# Patient Record
Sex: Female | Born: 1950 | Race: White | Hispanic: No | Marital: Married | Smoking: Current every day smoker
Health system: Southern US, Community
[De-identification: ages and names within clinical notes are randomized; demographics above are authoritative.]

## PROBLEM LIST (undated history)

## (undated) DIAGNOSIS — E785 Hyperlipidemia, unspecified: Secondary | ICD-10-CM

## (undated) DIAGNOSIS — F17210 Nicotine dependence, cigarettes, uncomplicated: Secondary | ICD-10-CM

## (undated) DIAGNOSIS — Z87828 Personal history of other (healed) physical injury and trauma: Secondary | ICD-10-CM

## (undated) DIAGNOSIS — M1990 Unspecified osteoarthritis, unspecified site: Secondary | ICD-10-CM

## (undated) HISTORY — DX: Nicotine dependence, cigarettes, uncomplicated: F17.210

## (undated) HISTORY — PX: KNEE SURGERY: SHX244

## (undated) HISTORY — PX: SHOULDER SURGERY: SHX246

## (undated) HISTORY — DX: Personal history of other (healed) physical injury and trauma: Z87.828

## (undated) HISTORY — PX: JOINT REPLACEMENT: SHX530

## (undated) HISTORY — DX: Unspecified osteoarthritis, unspecified site: M19.90

## (undated) HISTORY — DX: Hyperlipidemia, unspecified: E78.5

---

## 2005-03-28 DIAGNOSIS — H905 Unspecified sensorineural hearing loss: Secondary | ICD-10-CM | POA: Insufficient documentation

## 2005-03-28 DIAGNOSIS — J309 Allergic rhinitis, unspecified: Secondary | ICD-10-CM | POA: Insufficient documentation

## 2015-07-31 DIAGNOSIS — C801 Malignant (primary) neoplasm, unspecified: Secondary | ICD-10-CM

## 2015-07-31 HISTORY — DX: Malignant (primary) neoplasm, unspecified: C80.1

## 2015-09-08 DIAGNOSIS — L720 Epidermal cyst: Secondary | ICD-10-CM | POA: Diagnosis not present

## 2015-09-08 DIAGNOSIS — Z85820 Personal history of malignant melanoma of skin: Secondary | ICD-10-CM | POA: Diagnosis not present

## 2015-09-20 DIAGNOSIS — L905 Scar conditions and fibrosis of skin: Secondary | ICD-10-CM | POA: Diagnosis not present

## 2015-09-20 DIAGNOSIS — L0202 Furuncle of face: Secondary | ICD-10-CM | POA: Diagnosis not present

## 2015-09-20 DIAGNOSIS — Z85820 Personal history of malignant melanoma of skin: Secondary | ICD-10-CM | POA: Diagnosis not present

## 2015-09-20 DIAGNOSIS — D0372 Melanoma in situ of left lower limb, including hip: Secondary | ICD-10-CM | POA: Diagnosis not present

## 2015-09-20 DIAGNOSIS — L821 Other seborrheic keratosis: Secondary | ICD-10-CM | POA: Diagnosis not present

## 2015-09-20 DIAGNOSIS — L720 Epidermal cyst: Secondary | ICD-10-CM | POA: Diagnosis not present

## 2015-09-20 DIAGNOSIS — D485 Neoplasm of uncertain behavior of skin: Secondary | ICD-10-CM | POA: Diagnosis not present

## 2015-09-20 DIAGNOSIS — D0371 Melanoma in situ of right lower limb, including hip: Secondary | ICD-10-CM | POA: Diagnosis not present

## 2015-10-11 DIAGNOSIS — L905 Scar conditions and fibrosis of skin: Secondary | ICD-10-CM | POA: Diagnosis not present

## 2015-10-11 DIAGNOSIS — L821 Other seborrheic keratosis: Secondary | ICD-10-CM | POA: Diagnosis not present

## 2015-10-11 DIAGNOSIS — Z85820 Personal history of malignant melanoma of skin: Secondary | ICD-10-CM | POA: Diagnosis not present

## 2015-10-11 DIAGNOSIS — D225 Melanocytic nevi of trunk: Secondary | ICD-10-CM | POA: Diagnosis not present

## 2015-11-22 DIAGNOSIS — J300 Vasomotor rhinitis: Secondary | ICD-10-CM | POA: Diagnosis not present

## 2015-11-22 DIAGNOSIS — J3489 Other specified disorders of nose and nasal sinuses: Secondary | ICD-10-CM | POA: Diagnosis not present

## 2015-11-22 DIAGNOSIS — H60313 Diffuse otitis externa, bilateral: Secondary | ICD-10-CM | POA: Diagnosis not present

## 2015-11-22 DIAGNOSIS — J342 Deviated nasal septum: Secondary | ICD-10-CM | POA: Diagnosis not present

## 2016-01-09 DIAGNOSIS — J019 Acute sinusitis, unspecified: Secondary | ICD-10-CM | POA: Diagnosis not present

## 2016-01-09 DIAGNOSIS — S30861A Insect bite (nonvenomous) of abdominal wall, initial encounter: Secondary | ICD-10-CM | POA: Diagnosis not present

## 2016-02-01 DIAGNOSIS — S82832A Other fracture of upper and lower end of left fibula, initial encounter for closed fracture: Secondary | ICD-10-CM | POA: Diagnosis not present

## 2016-02-01 DIAGNOSIS — S80212A Abrasion, left knee, initial encounter: Secondary | ICD-10-CM | POA: Diagnosis not present

## 2016-02-01 DIAGNOSIS — S82892A Other fracture of left lower leg, initial encounter for closed fracture: Secondary | ICD-10-CM | POA: Diagnosis not present

## 2016-02-01 DIAGNOSIS — W108XXA Fall (on) (from) other stairs and steps, initial encounter: Secondary | ICD-10-CM | POA: Diagnosis not present

## 2016-02-01 DIAGNOSIS — F17200 Nicotine dependence, unspecified, uncomplicated: Secondary | ICD-10-CM | POA: Diagnosis not present

## 2016-02-01 DIAGNOSIS — S80211A Abrasion, right knee, initial encounter: Secondary | ICD-10-CM | POA: Diagnosis not present

## 2016-02-01 DIAGNOSIS — M25462 Effusion, left knee: Secondary | ICD-10-CM | POA: Diagnosis not present

## 2016-02-01 DIAGNOSIS — M25562 Pain in left knee: Secondary | ICD-10-CM | POA: Diagnosis not present

## 2016-02-08 DIAGNOSIS — S82832A Other fracture of upper and lower end of left fibula, initial encounter for closed fracture: Secondary | ICD-10-CM | POA: Diagnosis not present

## 2016-02-15 DIAGNOSIS — S82832D Other fracture of upper and lower end of left fibula, subsequent encounter for closed fracture with routine healing: Secondary | ICD-10-CM | POA: Diagnosis not present

## 2016-03-02 DIAGNOSIS — M25572 Pain in left ankle and joints of left foot: Secondary | ICD-10-CM | POA: Diagnosis not present

## 2016-04-06 DIAGNOSIS — S82832D Other fracture of upper and lower end of left fibula, subsequent encounter for closed fracture with routine healing: Secondary | ICD-10-CM | POA: Diagnosis not present

## 2016-04-06 DIAGNOSIS — M1712 Unilateral primary osteoarthritis, left knee: Secondary | ICD-10-CM | POA: Diagnosis not present

## 2016-04-10 DIAGNOSIS — L821 Other seborrheic keratosis: Secondary | ICD-10-CM | POA: Diagnosis not present

## 2016-04-10 DIAGNOSIS — Z85820 Personal history of malignant melanoma of skin: Secondary | ICD-10-CM | POA: Diagnosis not present

## 2016-04-10 DIAGNOSIS — D225 Melanocytic nevi of trunk: Secondary | ICD-10-CM | POA: Diagnosis not present

## 2016-04-10 DIAGNOSIS — L905 Scar conditions and fibrosis of skin: Secondary | ICD-10-CM | POA: Diagnosis not present

## 2016-04-10 DIAGNOSIS — L720 Epidermal cyst: Secondary | ICD-10-CM | POA: Diagnosis not present

## 2016-05-04 DIAGNOSIS — M1712 Unilateral primary osteoarthritis, left knee: Secondary | ICD-10-CM | POA: Diagnosis not present

## 2016-05-04 DIAGNOSIS — M25572 Pain in left ankle and joints of left foot: Secondary | ICD-10-CM | POA: Diagnosis not present

## 2016-05-16 DIAGNOSIS — M25672 Stiffness of left ankle, not elsewhere classified: Secondary | ICD-10-CM | POA: Diagnosis not present

## 2016-05-16 DIAGNOSIS — M25572 Pain in left ankle and joints of left foot: Secondary | ICD-10-CM | POA: Diagnosis not present

## 2016-05-16 DIAGNOSIS — M25472 Effusion, left ankle: Secondary | ICD-10-CM | POA: Diagnosis not present

## 2016-05-22 DIAGNOSIS — M25472 Effusion, left ankle: Secondary | ICD-10-CM | POA: Diagnosis not present

## 2016-05-22 DIAGNOSIS — M25572 Pain in left ankle and joints of left foot: Secondary | ICD-10-CM | POA: Diagnosis not present

## 2016-05-22 DIAGNOSIS — M25672 Stiffness of left ankle, not elsewhere classified: Secondary | ICD-10-CM | POA: Diagnosis not present

## 2016-05-25 DIAGNOSIS — M25672 Stiffness of left ankle, not elsewhere classified: Secondary | ICD-10-CM | POA: Diagnosis not present

## 2016-05-25 DIAGNOSIS — M25472 Effusion, left ankle: Secondary | ICD-10-CM | POA: Diagnosis not present

## 2016-05-25 DIAGNOSIS — M25572 Pain in left ankle and joints of left foot: Secondary | ICD-10-CM | POA: Diagnosis not present

## 2016-05-29 DIAGNOSIS — M25672 Stiffness of left ankle, not elsewhere classified: Secondary | ICD-10-CM | POA: Diagnosis not present

## 2016-05-29 DIAGNOSIS — M25572 Pain in left ankle and joints of left foot: Secondary | ICD-10-CM | POA: Diagnosis not present

## 2016-05-29 DIAGNOSIS — M25472 Effusion, left ankle: Secondary | ICD-10-CM | POA: Diagnosis not present

## 2016-06-01 DIAGNOSIS — M25472 Effusion, left ankle: Secondary | ICD-10-CM | POA: Diagnosis not present

## 2016-06-01 DIAGNOSIS — M25572 Pain in left ankle and joints of left foot: Secondary | ICD-10-CM | POA: Diagnosis not present

## 2016-06-01 DIAGNOSIS — M25672 Stiffness of left ankle, not elsewhere classified: Secondary | ICD-10-CM | POA: Diagnosis not present

## 2016-06-05 DIAGNOSIS — M25672 Stiffness of left ankle, not elsewhere classified: Secondary | ICD-10-CM | POA: Diagnosis not present

## 2016-06-05 DIAGNOSIS — M25472 Effusion, left ankle: Secondary | ICD-10-CM | POA: Diagnosis not present

## 2016-06-05 DIAGNOSIS — M25572 Pain in left ankle and joints of left foot: Secondary | ICD-10-CM | POA: Diagnosis not present

## 2016-06-12 DIAGNOSIS — M25672 Stiffness of left ankle, not elsewhere classified: Secondary | ICD-10-CM | POA: Diagnosis not present

## 2016-06-12 DIAGNOSIS — M25472 Effusion, left ankle: Secondary | ICD-10-CM | POA: Diagnosis not present

## 2016-06-12 DIAGNOSIS — M25572 Pain in left ankle and joints of left foot: Secondary | ICD-10-CM | POA: Diagnosis not present

## 2016-06-14 DIAGNOSIS — M25472 Effusion, left ankle: Secondary | ICD-10-CM | POA: Diagnosis not present

## 2016-06-14 DIAGNOSIS — M25572 Pain in left ankle and joints of left foot: Secondary | ICD-10-CM | POA: Diagnosis not present

## 2016-06-14 DIAGNOSIS — M25672 Stiffness of left ankle, not elsewhere classified: Secondary | ICD-10-CM | POA: Diagnosis not present

## 2017-09-09 DIAGNOSIS — Z85820 Personal history of malignant melanoma of skin: Secondary | ICD-10-CM | POA: Diagnosis not present

## 2017-09-09 DIAGNOSIS — D225 Melanocytic nevi of trunk: Secondary | ICD-10-CM | POA: Diagnosis not present

## 2017-09-09 DIAGNOSIS — L905 Scar conditions and fibrosis of skin: Secondary | ICD-10-CM | POA: Diagnosis not present

## 2017-09-09 DIAGNOSIS — L821 Other seborrheic keratosis: Secondary | ICD-10-CM | POA: Diagnosis not present

## 2017-09-09 DIAGNOSIS — D485 Neoplasm of uncertain behavior of skin: Secondary | ICD-10-CM | POA: Diagnosis not present

## 2017-09-19 DIAGNOSIS — M25552 Pain in left hip: Secondary | ICD-10-CM | POA: Diagnosis not present

## 2017-09-19 DIAGNOSIS — Z0189 Encounter for other specified special examinations: Secondary | ICD-10-CM | POA: Diagnosis not present

## 2017-09-19 DIAGNOSIS — M1712 Unilateral primary osteoarthritis, left knee: Secondary | ICD-10-CM | POA: Diagnosis not present

## 2017-09-19 DIAGNOSIS — R296 Repeated falls: Secondary | ICD-10-CM | POA: Diagnosis not present

## 2017-09-19 DIAGNOSIS — W19XXXA Unspecified fall, initial encounter: Secondary | ICD-10-CM | POA: Diagnosis not present

## 2017-09-19 DIAGNOSIS — M25562 Pain in left knee: Secondary | ICD-10-CM | POA: Diagnosis not present

## 2017-09-19 DIAGNOSIS — M25512 Pain in left shoulder: Secondary | ICD-10-CM | POA: Diagnosis not present

## 2017-10-24 DIAGNOSIS — M1712 Unilateral primary osteoarthritis, left knee: Secondary | ICD-10-CM | POA: Diagnosis not present

## 2017-10-29 DIAGNOSIS — M1712 Unilateral primary osteoarthritis, left knee: Secondary | ICD-10-CM | POA: Insufficient documentation

## 2017-11-04 ENCOUNTER — Encounter: Payer: Self-pay | Admitting: Family Medicine

## 2017-11-04 ENCOUNTER — Ambulatory Visit (INDEPENDENT_AMBULATORY_CARE_PROVIDER_SITE_OTHER): Payer: Medicare Other | Admitting: Family Medicine

## 2017-11-04 ENCOUNTER — Ambulatory Visit (INDEPENDENT_AMBULATORY_CARE_PROVIDER_SITE_OTHER): Payer: Medicare Other

## 2017-11-04 VITALS — BP 131/78 | HR 90 | Temp 97.7°F | Ht 66.0 in | Wt 157.2 lb

## 2017-11-04 DIAGNOSIS — Z130 Encounter for screening for diseases of the blood and blood-forming organs and certain disorders involving the immune mechanism: Secondary | ICD-10-CM | POA: Diagnosis not present

## 2017-11-04 DIAGNOSIS — J449 Chronic obstructive pulmonary disease, unspecified: Secondary | ICD-10-CM | POA: Diagnosis not present

## 2017-11-04 DIAGNOSIS — Z13228 Encounter for screening for other metabolic disorders: Secondary | ICD-10-CM

## 2017-11-04 DIAGNOSIS — Z7689 Persons encountering health services in other specified circumstances: Secondary | ICD-10-CM | POA: Diagnosis not present

## 2017-11-04 DIAGNOSIS — Z01818 Encounter for other preprocedural examination: Secondary | ICD-10-CM

## 2017-11-04 DIAGNOSIS — Z13220 Encounter for screening for lipoid disorders: Secondary | ICD-10-CM

## 2017-11-04 DIAGNOSIS — Z1159 Encounter for screening for other viral diseases: Secondary | ICD-10-CM

## 2017-11-04 DIAGNOSIS — Z720 Tobacco use: Secondary | ICD-10-CM

## 2017-11-04 DIAGNOSIS — Z136 Encounter for screening for cardiovascular disorders: Secondary | ICD-10-CM | POA: Diagnosis not present

## 2017-11-04 DIAGNOSIS — Z114 Encounter for screening for human immunodeficiency virus [HIV]: Secondary | ICD-10-CM

## 2017-11-04 DIAGNOSIS — M1712 Unilateral primary osteoarthritis, left knee: Secondary | ICD-10-CM

## 2017-11-04 NOTE — Progress Notes (Addendum)
Subjective: LN:[id number] care, surgical clearance HPI: Cheryl Gonzalez is a 67 y.o. female presenting to clinic today for:  Surgical Clearance Patient is here to establish care and for surgical clearance.  She plans to have a left TKA with Dr. Carney Corners on 12/27/2017.  1) High Risk Cardiac Conditions  1) Recent MI - No.  2) Decompensated Heart Failure - No.  3) Unstable angina - No.  4) Symptomatic arrythmia - No.  5) Sx Valvular Disease - No.  2) Intermediate Risk Factors - DM, CKD, CVA, CHF, CAD - No.  2) Functional Status - > 4 mets (Walk, run, climb stairs) Yes.  Rob Hickman Activity Status Index: 44.7  3) Surgery Specific Risk -    Intermediate (Carotid, Head and Neck, Orthopaedic)         4) Further Noninvasive evaluation -   1) EKG - Yes.     1) NO Hx of CVA, CAD, DM, CKD but no prior records.  Will obtain baseline.  2) Echo - No.   1) NO dyspnea on exertion, shortness of breath, or hx cardiopulmonary disease.  3) Stress Testing - Active Cardiac Disease - No.  5) Need for medical therapy - Beta Blocker, Statins indicated ? No.  Past Medical History:  Diagnosis Date  . Cigarette smoker   . History of meniscal tear    Past Surgical History:  Procedure Laterality Date  . JOINT REPLACEMENT    . KNEE SURGERY Right   . SHOULDER SURGERY Left    Social History   Socioeconomic History  . Marital status: Married    Spouse name: Not on file  . Number of children: 0  . Years of education: Not on file  . Highest education level: Not on file  Occupational History  . Not on file  Social Needs  . Financial resource strain: Not on file  . Food insecurity:    Worry: Not on file    Inability: Not on file  . Transportation needs:    Medical: Not on file    Non-medical: Not on file  Tobacco Use  . Smoking status: Current Every Day Smoker  . Smokeless tobacco: Never Used  . Tobacco comment: Two cigarettes daily. 28 pack year history (until 2019)  Substance and Sexual  Activity  . Alcohol use: Not Currently    Comment: Several times yearly.  . Drug use: Never  . Sexual activity: Yes    Birth control/protection: Post-menopausal  Lifestyle  . Physical activity:    Days per week: Not on file    Minutes per session: Not on file  . Stress: Not on file  Relationships  . Social connections:    Talks on phone: Not on file    Gets together: Not on file    Attends religious service: Not on file    Active member of club or organization: Not on file    Attends meetings of clubs or organizations: Not on file    Relationship status: Not on file  . Intimate partner violence:    Fear of current or ex partner: Not on file    Emotionally abused: Not on file    Physically abused: Not on file    Forced sexual activity: Not on file  Other Topics Concern  . Not on file  Social History Narrative  . Not on file   Current Meds  Medication Sig  . acetaminophen (TYLENOL) 500 MG tablet Take by mouth.  . Biotin [place]  5,000 mg by mouth daily.  . cetirizine (ZYRTEC) 10 MG tablet Take 10 mg by mouth daily.  Marland Kitchen ibuprofen (ADVIL) 200 MG tablet Use As Directed   Family History  Problem Relation Age of Onset  . Arthritis Mother   . Stroke Mother 50  . Hearing loss Mother   . Heart attack Father        as a result of anesthesia/ ankle surgery  . Cirrhosis Brother   . Alcohol abuse Brother    Allergies  Allergen Reactions  . Black Walnut Pollen Allergy Skin Test Itching  . Citrullus Vulgaris Itching  . Other Itching     Health Maintenance: Needs screening mammogram, screening colonoscopy.  Patient reports Pap smear 2 years ago which was normal.  Unsure if she can get these records.  ROS: Per HPI  Objective: Office vital signs reviewed. BP 131/78   Pulse 90   Temp 97.7 F (36.5 C) (Oral)   Ht _0  (1.676 m)   Wt 157 lb 3.2 oz (71.3 kg)   SpO2 97%   BMI 25.37 kg/m   Physical Examination:  General: Awake, alert, well nourished, well  appearing, No acute distress HEENT: Normal    Neck: No masses palpated. No lymphadenopathy    Ears: Tympanic membranes intact, normal light reflex, no erythema, no bulging    Eyes: PERRLA, extraocular movement in tact, sclera white    Nose: nasal turbinates moist, no nasal discharge    Throat: moist mucus membranes, no erythema, no tonsillar exudate.  Airway is patent.  Mallampati 2. Cardio: regular rate and rhythm, S1S2 heard, no murmurs appreciated Pulm: clear to auscultation bilaterally, no wheezes, rhonchi or rales; normal work of breathing on room air Extremities: warm, well perfused, No edema, cyanosis or clubbing; +2 pulses bilaterally MSK: antalgic gait and normal station Skin: dry; intact; no rashes or lesions Neuro: No focal neurologic deficits. Psych: Mood stable, speech normal, affect appropriate, pleasant  Depression screen PHQ 2/9 11/04/2017  Decreased Interest 0  Down, Depressed, Hopeless 0  PHQ - 2 Score 0   Assessment/ Plan: 67 y.o. female   I have independently evaluated patient.  EKG with no evidence of ischemia or arrhythmia.  Chest x-ray with mild hyperinflation of lungs but normal cardiac silhouette and no focal infiltrates.  Cheryl Gonzalez is a 67 y.o. female who is low risk (pending lab work) for an Intermediate risk surgery.  There are modifiable risk factors (smoking).  Natassja Ollis RCRI calculation for MACE is: 0 pending renal function testing.     1. Primary osteoarthritis of left knee Scheduled for surgery with Dr Collene Mares.  2. Pre-op evaluation See above. - DG Chest 2 View; Future - EKG 12-Lead  3. Establishing care with new doctor, encounter for  4. Tobacco abuse Currently in action phase of cessation.  5. Screening for lipid disorders - Lipid Panel  6. Screening for metabolic disorder - NGE95+MWUX  7. Screening for deficiency anemia - CBC with Differential  8. Encounter for hepatitis C screening test for low risk patient - Hepatitis C  antibody  9. Screening for HIV without presence of risk factors - HIV antibody (with reflex)   Maralee Higuchi Windell Moulding, DO [place] [phone]

## 2017-11-04 NOTE — Patient Instructions (Signed)
I highly recommend that you discontinue smoking prior to your surgery.  This will reduce your risk of complication significantly.  Because we have no medical records for you, I have ordered labs, a chest x-ray and an EKG to risk stratify you.  You will be contacted with the results of the labs once they are available, usually in the next 3 days for routine lab work.   Steps to Quit Smoking Smoking tobacco can be bad for your health. It can also affect almost every organ in your body. Smoking puts you and people around you at risk for many serious long-lasting (chronic) diseases. Quitting smoking is hard, but it is one of the best things that you can do for your health. It is never too late to quit. What are the benefits of quitting smoking? When you quit smoking, you lower your risk for getting serious diseases and conditions. They can include:  Lung cancer or lung disease.  Heart disease.  Stroke.  Heart attack.  Not being able to have children (infertility).  Weak bones (osteoporosis) and broken bones (fractures).  If you have coughing, wheezing, and shortness of breath, those symptoms may get better when you quit. You may also get sick less often. If you are pregnant, quitting smoking can help to lower your chances of having a baby of low birth weight. What can I do to help me quit smoking? Talk with your doctor about what can help you quit smoking. Some things you can do (strategies) include:  Quitting smoking totally, instead of slowly cutting back how much you smoke over a period of time.  Going to in-person counseling. You are more likely to quit if you go to many counseling sessions.  Using resources and support systems, such as: ? Database administrator with a Social worker. ? Phone quitlines. ? Careers information officer. ? Support groups or group counseling. ? Text messaging programs. ? Mobile phone apps or applications.  Taking medicines. Some of these medicines may have nicotine  in them. If you are pregnant or breastfeeding, do not take any medicines to quit smoking unless your doctor says it is okay. Talk with your doctor about counseling or other things that can help you.  Talk with your doctor about using more than one strategy at the same time, such as taking medicines while you are also going to in-person counseling. This can help make quitting easier. What things can I do to make it easier to quit? Quitting smoking might feel very hard at first, but there is a lot that you can do to make it easier. Take these steps:  Talk to your family and friends. Ask them to support and encourage you.  Call phone quitlines, reach out to support groups, or work with a Social worker.  Ask people who smoke to not smoke around you.  Avoid places that make you want (trigger) to smoke, such as: ? Bars. ? Parties. ? Smoke-break areas at work.  Spend time with people who do not smoke.  Lower the stress in your life. Stress can make you want to smoke. Try these things to help your stress: ? Getting regular exercise. ? Deep-breathing exercises. ? Yoga. ? Meditating. ? Doing a body scan. To do this, close your eyes, focus on one area of your body at a time from head to toe, and notice which parts of your body are tense. Try to relax the muscles in those areas.  Download or buy apps on your mobile phone or tablet  that can help you stick to your quit plan. There are many free apps, such as QuitGuide from the [employer] Office manager for Disease Control and Prevention). You can find more support from smokefree.gov and other websites.  This information is not intended to replace advice given to you by your health care provider. Make sure you discuss any questions you have with your health care provider. Document Released: 05/12/2009 Document Revised: 03/13/2016 Document Reviewed: 11/30/2014 Elsevier Interactive Patient Education  2018 [employer].

## 2017-11-05 LAB — CMP14+EGFR
ALT: 12 IU/L (ref 0–32)
AST: 16 IU/L (ref 0–40)
Albumin/Globulin Ratio: 1.7 (ref 1.2–2.2)
Albumin: 4.5 g/dL (ref 3.6–4.8)
Alkaline Phosphatase: 79 IU/L (ref 39–117)
BUN/Creatinine Ratio: 17 (ref 12–28)
BUN: 15 mg/dL (ref 8–27)
Bilirubin Total: 0.3 mg/dL (ref 0.0–1.2)
CALCIUM: 8.8 mg/dL (ref 8.7–10.3)
CO2: 22 mmol/L (ref 20–29)
Chloride: 104 mmol/L (ref 96–106)
Creatinine, Ser: 0.89 mg/dL (ref 0.57–1.00)
GFR, EST AFRICAN AMERICAN: 77 mL/min/{1.73_m2} (ref >59)
GFR, EST NON AFRICAN AMERICAN: 67 mL/min/{1.73_m2} (ref >59)
GLUCOSE: 100 mg/dL — AB (ref 65–99)
Globulin, Total: 2.6 g/dL (ref 1.5–4.5)
Potassium: 4.2 mmol/L (ref 3.5–5.2)
Sodium: 142 mmol/L (ref 134–144)
TOTAL PROTEIN: 7.1 g/dL (ref 6.0–8.5)

## 2017-11-05 LAB — LIPID PANEL
CHOLESTEROL TOTAL: 209 mg/dL — AB (ref 100–199)
Chol/HDL Ratio: 4 ratio (ref 0.0–4.4)
HDL: 52 mg/dL (ref >39)
LDL CALC: 133 mg/dL — AB (ref 0–99)
Triglycerides: 121 mg/dL (ref 0–149)
VLDL CHOLESTEROL CAL: 24 mg/dL (ref 5–40)

## 2017-11-05 LAB — CBC WITH DIFFERENTIAL/PLATELET
BASOS ABS: 0 10*3/uL (ref 0.0–0.2)
BASOS: 1 %
EOS (ABSOLUTE): 0.4 10*3/uL (ref 0.0–0.4)
Eos: 6 %
HEMOGLOBIN: 13.1 g/dL (ref 11.1–15.9)
Hematocrit: 39.3 % (ref 34.0–46.6)
IMMATURE GRANS (ABS): 0 10*3/uL (ref 0.0–0.1)
Immature Granulocytes: 0 %
LYMPHS: 33 %
Lymphocytes Absolute: 2.1 10*3/uL (ref 0.7–3.1)
MCH: 28.4 pg (ref 26.6–33.0)
MCHC: 33.3 g/dL (ref 31.5–35.7)
MCV: 85 fL (ref 79–97)
MONOCYTES: 7 %
Monocytes Absolute: 0.4 10*3/uL (ref 0.1–0.9)
NEUTROS ABS: 3.4 10*3/uL (ref 1.4–7.0)
Neutrophils: 53 %
Platelets: 365 10*3/uL (ref 150–379)
RBC: 4.62 x10E6/uL (ref 3.77–5.28)
RDW: 13.6 % (ref 12.3–15.4)
WBC: 6.4 10*3/uL (ref 3.4–10.8)

## 2017-11-05 LAB — HEPATITIS C ANTIBODY: Hep C Virus Ab: <0.1 s/co ratio (ref 0.0–0.9)

## 2017-11-05 LAB — HIV ANTIBODY (ROUTINE TESTING W REFLEX): HIV Screen 4th Generation wRfx: NONREACTIVE

## 2017-11-06 ENCOUNTER — Telehealth: Payer: Self-pay | Admitting: Family Medicine

## 2017-11-08 ENCOUNTER — Other Ambulatory Visit: Payer: Self-pay | Admitting: Family Medicine

## 2017-11-08 DIAGNOSIS — E782 Mixed hyperlipidemia: Secondary | ICD-10-CM

## 2017-11-08 MED ORDER — ATORVASTATIN CALCIUM 40 MG PO TABS: 40.0000 mg | ORAL_TABLET | Freq: Every day | ORAL | 1 refills | Status: DC

## 2017-11-08 NOTE — Telephone Encounter (Signed)
I spoke to patient.  Surgery will remain scheduled for the end of May.  Lipitor sent in to pharmacy.  Patient to follow up in 3 months for recheck.

## 2017-12-06 DIAGNOSIS — Z01810 Encounter for preprocedural cardiovascular examination: Secondary | ICD-10-CM | POA: Diagnosis not present

## 2017-12-06 DIAGNOSIS — Z23 Encounter for immunization: Secondary | ICD-10-CM | POA: Diagnosis not present

## 2017-12-06 DIAGNOSIS — M1712 Unilateral primary osteoarthritis, left knee: Secondary | ICD-10-CM | POA: Diagnosis not present

## 2017-12-06 DIAGNOSIS — Z01812 Encounter for preprocedural laboratory examination: Secondary | ICD-10-CM | POA: Diagnosis not present

## 2017-12-06 LAB — PROTIME-INR

## 2017-12-20 DIAGNOSIS — M1712 Unilateral primary osteoarthritis, left knee: Secondary | ICD-10-CM | POA: Diagnosis not present

## 2017-12-27 DIAGNOSIS — Z96652 Presence of left artificial knee joint: Secondary | ICD-10-CM | POA: Diagnosis not present

## 2017-12-27 DIAGNOSIS — Z8674 Personal history of sudden cardiac arrest: Secondary | ICD-10-CM | POA: Diagnosis not present

## 2017-12-27 DIAGNOSIS — R42 Dizziness and giddiness: Secondary | ICD-10-CM | POA: Diagnosis not present

## 2017-12-27 DIAGNOSIS — F17210 Nicotine dependence, cigarettes, uncomplicated: Secondary | ICD-10-CM | POA: Diagnosis not present

## 2017-12-27 DIAGNOSIS — Z4889 Encounter for other specified surgical aftercare: Secondary | ICD-10-CM | POA: Diagnosis not present

## 2017-12-27 DIAGNOSIS — Z85820 Personal history of malignant melanoma of skin: Secondary | ICD-10-CM | POA: Diagnosis not present

## 2017-12-27 DIAGNOSIS — Z8261 Family history of arthritis: Secondary | ICD-10-CM | POA: Diagnosis not present

## 2017-12-27 DIAGNOSIS — H9190 Unspecified hearing loss, unspecified ear: Secondary | ICD-10-CM | POA: Diagnosis not present

## 2017-12-27 DIAGNOSIS — M1712 Unilateral primary osteoarthritis, left knee: Secondary | ICD-10-CM | POA: Diagnosis not present

## 2017-12-27 DIAGNOSIS — M25562 Pain in left knee: Secondary | ICD-10-CM | POA: Diagnosis not present

## 2017-12-27 DIAGNOSIS — D62 Acute posthemorrhagic anemia: Secondary | ICD-10-CM | POA: Diagnosis not present

## 2017-12-28 DIAGNOSIS — Z8261 Family history of arthritis: Secondary | ICD-10-CM | POA: Diagnosis not present

## 2017-12-28 DIAGNOSIS — H9190 Unspecified hearing loss, unspecified ear: Secondary | ICD-10-CM | POA: Diagnosis not present

## 2017-12-28 DIAGNOSIS — R42 Dizziness and giddiness: Secondary | ICD-10-CM | POA: Diagnosis not present

## 2017-12-28 DIAGNOSIS — F17210 Nicotine dependence, cigarettes, uncomplicated: Secondary | ICD-10-CM | POA: Diagnosis not present

## 2017-12-28 DIAGNOSIS — D62 Acute posthemorrhagic anemia: Secondary | ICD-10-CM | POA: Diagnosis not present

## 2017-12-28 DIAGNOSIS — M1712 Unilateral primary osteoarthritis, left knee: Secondary | ICD-10-CM | POA: Diagnosis not present

## 2017-12-30 DIAGNOSIS — Z85820 Personal history of malignant melanoma of skin: Secondary | ICD-10-CM | POA: Diagnosis not present

## 2017-12-30 DIAGNOSIS — Z96652 Presence of left artificial knee joint: Secondary | ICD-10-CM | POA: Diagnosis not present

## 2017-12-30 DIAGNOSIS — M1991 Primary osteoarthritis, unspecified site: Secondary | ICD-10-CM | POA: Diagnosis not present

## 2017-12-30 DIAGNOSIS — Z471 Aftercare following joint replacement surgery: Secondary | ICD-10-CM | POA: Diagnosis not present

## 2017-12-30 DIAGNOSIS — Z4802 Encounter for removal of sutures: Secondary | ICD-10-CM | POA: Diagnosis not present

## 2017-12-30 DIAGNOSIS — Z96651 Presence of right artificial knee joint: Secondary | ICD-10-CM | POA: Diagnosis not present

## 2017-12-31 DIAGNOSIS — M1991 Primary osteoarthritis, unspecified site: Secondary | ICD-10-CM | POA: Diagnosis not present

## 2017-12-31 DIAGNOSIS — Z96652 Presence of left artificial knee joint: Secondary | ICD-10-CM | POA: Diagnosis not present

## 2017-12-31 DIAGNOSIS — Z85820 Personal history of malignant melanoma of skin: Secondary | ICD-10-CM | POA: Diagnosis not present

## 2017-12-31 DIAGNOSIS — Z471 Aftercare following joint replacement surgery: Secondary | ICD-10-CM | POA: Diagnosis not present

## 2017-12-31 DIAGNOSIS — Z4802 Encounter for removal of sutures: Secondary | ICD-10-CM | POA: Diagnosis not present

## 2017-12-31 DIAGNOSIS — Z96651 Presence of right artificial knee joint: Secondary | ICD-10-CM | POA: Diagnosis not present

## 2018-01-02 DIAGNOSIS — Z4802 Encounter for removal of sutures: Secondary | ICD-10-CM | POA: Diagnosis not present

## 2018-01-02 DIAGNOSIS — Z96651 Presence of right artificial knee joint: Secondary | ICD-10-CM | POA: Diagnosis not present

## 2018-01-02 DIAGNOSIS — Z471 Aftercare following joint replacement surgery: Secondary | ICD-10-CM | POA: Diagnosis not present

## 2018-01-02 DIAGNOSIS — Z96652 Presence of left artificial knee joint: Secondary | ICD-10-CM | POA: Diagnosis not present

## 2018-01-02 DIAGNOSIS — M1991 Primary osteoarthritis, unspecified site: Secondary | ICD-10-CM | POA: Diagnosis not present

## 2018-01-02 DIAGNOSIS — Z85820 Personal history of malignant melanoma of skin: Secondary | ICD-10-CM | POA: Diagnosis not present

## 2018-01-03 DIAGNOSIS — M1991 Primary osteoarthritis, unspecified site: Secondary | ICD-10-CM | POA: Diagnosis not present

## 2018-01-03 DIAGNOSIS — Z96652 Presence of left artificial knee joint: Secondary | ICD-10-CM | POA: Diagnosis not present

## 2018-01-03 DIAGNOSIS — Z4802 Encounter for removal of sutures: Secondary | ICD-10-CM | POA: Diagnosis not present

## 2018-01-03 DIAGNOSIS — Z85820 Personal history of malignant melanoma of skin: Secondary | ICD-10-CM | POA: Diagnosis not present

## 2018-01-03 DIAGNOSIS — Z96651 Presence of right artificial knee joint: Secondary | ICD-10-CM | POA: Diagnosis not present

## 2018-01-03 DIAGNOSIS — Z471 Aftercare following joint replacement surgery: Secondary | ICD-10-CM | POA: Diagnosis not present

## 2018-01-06 DIAGNOSIS — Z96652 Presence of left artificial knee joint: Secondary | ICD-10-CM | POA: Diagnosis not present

## 2018-01-06 DIAGNOSIS — Z85820 Personal history of malignant melanoma of skin: Secondary | ICD-10-CM | POA: Diagnosis not present

## 2018-01-06 DIAGNOSIS — M1991 Primary osteoarthritis, unspecified site: Secondary | ICD-10-CM | POA: Diagnosis not present

## 2018-01-06 DIAGNOSIS — Z471 Aftercare following joint replacement surgery: Secondary | ICD-10-CM | POA: Diagnosis not present

## 2018-01-06 DIAGNOSIS — Z96651 Presence of right artificial knee joint: Secondary | ICD-10-CM | POA: Diagnosis not present

## 2018-01-06 DIAGNOSIS — Z4802 Encounter for removal of sutures: Secondary | ICD-10-CM | POA: Diagnosis not present

## 2018-01-08 DIAGNOSIS — Z96651 Presence of right artificial knee joint: Secondary | ICD-10-CM | POA: Diagnosis not present

## 2018-01-08 DIAGNOSIS — M1991 Primary osteoarthritis, unspecified site: Secondary | ICD-10-CM | POA: Diagnosis not present

## 2018-01-08 DIAGNOSIS — Z96652 Presence of left artificial knee joint: Secondary | ICD-10-CM | POA: Diagnosis not present

## 2018-01-08 DIAGNOSIS — Z4802 Encounter for removal of sutures: Secondary | ICD-10-CM | POA: Diagnosis not present

## 2018-01-08 DIAGNOSIS — Z471 Aftercare following joint replacement surgery: Secondary | ICD-10-CM | POA: Diagnosis not present

## 2018-01-08 DIAGNOSIS — Z85820 Personal history of malignant melanoma of skin: Secondary | ICD-10-CM | POA: Diagnosis not present

## 2018-01-10 DIAGNOSIS — M1991 Primary osteoarthritis, unspecified site: Secondary | ICD-10-CM | POA: Diagnosis not present

## 2018-01-10 DIAGNOSIS — Z4802 Encounter for removal of sutures: Secondary | ICD-10-CM | POA: Diagnosis not present

## 2018-01-10 DIAGNOSIS — Z96651 Presence of right artificial knee joint: Secondary | ICD-10-CM | POA: Diagnosis not present

## 2018-01-10 DIAGNOSIS — Z471 Aftercare following joint replacement surgery: Secondary | ICD-10-CM | POA: Diagnosis not present

## 2018-01-10 DIAGNOSIS — Z96652 Presence of left artificial knee joint: Secondary | ICD-10-CM | POA: Diagnosis not present

## 2018-01-10 DIAGNOSIS — Z85820 Personal history of malignant melanoma of skin: Secondary | ICD-10-CM | POA: Diagnosis not present

## 2018-01-15 ENCOUNTER — Ambulatory Visit: Payer: Medicare Other | Attending: General Surgery | Admitting: Physical Therapy

## 2018-01-15 ENCOUNTER — Encounter: Payer: Self-pay | Admitting: Physical Therapy

## 2018-01-15 DIAGNOSIS — M25562 Pain in left knee: Secondary | ICD-10-CM | POA: Diagnosis not present

## 2018-01-15 DIAGNOSIS — R262 Difficulty in walking, not elsewhere classified: Secondary | ICD-10-CM

## 2018-01-15 DIAGNOSIS — M25662 Stiffness of left knee, not elsewhere classified: Secondary | ICD-10-CM | POA: Insufficient documentation

## 2018-01-15 NOTE — Therapy (Signed)
[facility] [place], [place], [address] Phone: [phone]   Fax:  [phone]  Physical Therapy Evaluation  Patient Details  Name: Cheryl Gonzalez Grade MRN: [id number] Date of Birth: [birth year] Referring Provider: Cheral Almas   Encounter Date: 01/15/2018  PT End of Session - 01/15/18 2203    Visit Number  1    Number of Visits  12    Date for PT Re-Evaluation  02/19/18    Authorization Type  FOTO every 5th visit, progress note every 10th visit, KX modifier at 15th visit    PT Start Time  0946    PT Stop Time  1033    PT Time Calculation (min)  47 min    Activity Tolerance  Patient tolerated treatment well    Behavior During Therapy  [facility] for tasks assessed/performed       Past Medical History:  Diagnosis Date  . Cigarette smoker   . History of meniscal tear     Past Surgical History:  Procedure Laterality Date  . JOINT REPLACEMENT    . KNEE SURGERY Right   . SHOULDER SURGERY Left     There were no vitals filed for this visit.   Subjective Assessment - 01/15/18 2148    Subjective  Patient arrives with husband with reports of left knee pain and difficulty walking due to a left total knee replacement on Dec 27, 2017. Patient states she has had 2 weeks of home health physical therapy and has been discharged from their care. Patient reports she has been compliant with her HEP and walks throughout her home without an AD. When she ambulates in the community, she uses her walker for safety. Patient reports pain at worst is 10/10 when she is not ahead of her pain medication. At best, she is a 0/10 with pain medication and rest. Patient has not been negotiating steps to the second floor where her bedroom is; patient reports she sleeps on the first floor. Patient's goals are to decrease pain, walk without an AD, and ambulate steps.    Patient is accompained by:  Family member husband    Pertinent History  right TKA, and previous  shoulder surgery    Limitations  Sitting;Standing;Walking;House hold activities    Patient Stated Goals  walk without walker    Currently in Pain?  Yes    Pain Score  3     Pain Location  Knee    Pain Orientation  Left    Pain Descriptors / Indicators  Aching;Dull    Pain Type  Surgical pain    Pain Onset  1 to 4 weeks ago    Pain Frequency  Intermittent    Aggravating Factors   movement and not taking pain medication    Pain Relieving Factors  pain medication and rest    Effect of Pain on Daily Activities  difficulties with performing ADLS and home activities.         [facility] PT Assessment - 01/15/18 0001      Assessment   Medical Diagnosis  Left total knee replacement    Referring Provider  Cheral Almas    Onset Date/Surgical Date  12/27/17    Next MD Visit  February 10, 2018    Prior Therapy  yes, home health      Balance Screen   Has the patient fallen in the past 6 months  No    Has the patient had a decrease in activity level because of a  [place], [place], [address] Phone: [phone]   Fax:  [phone]   Name: Emmagene Ortner MRN: [id number] Date of Birth: Dec 09, 1950  [facility] [place], [place], [address] Phone: [phone]   Fax:  [phone]  Physical Therapy Evaluation  Patient Details  Name: Cheryl Gonzalez Grade MRN: [id number] Date of Birth: [birth year] Referring Provider: Cheral Almas   Encounter Date: 01/15/2018  PT End of Session - 01/15/18 2203    Visit Number  1    Number of Visits  12    Date for PT Re-Evaluation  02/19/18    Authorization Type  FOTO every 5th visit, progress note every 10th visit, KX modifier at 15th visit    PT Start Time  0946    PT Stop Time  1033    PT Time Calculation (min)  47 min    Activity Tolerance  Patient tolerated treatment well    Behavior During Therapy  [facility] for tasks assessed/performed       Past Medical History:  Diagnosis Date  . Cigarette smoker   . History of meniscal tear     Past Surgical History:  Procedure Laterality Date  . JOINT REPLACEMENT    . KNEE SURGERY Right   . SHOULDER SURGERY Left     There were no vitals filed for this visit.   Subjective Assessment - 01/15/18 2148    Subjective  Patient arrives with husband with reports of left knee pain and difficulty walking due to a left total knee replacement on Dec 27, 2017. Patient states she has had 2 weeks of home health physical therapy and has been discharged from their care. Patient reports she has been compliant with her HEP and walks throughout her home without an AD. When she ambulates in the community, she uses her walker for safety. Patient reports pain at worst is 10/10 when she is not ahead of her pain medication. At best, she is a 0/10 with pain medication and rest. Patient has not been negotiating steps to the second floor where her bedroom is; patient reports she sleeps on the first floor. Patient's goals are to decrease pain, walk without an AD, and ambulate steps.    Patient is accompained by:  Family member husband    Pertinent History  right TKA, and previous  shoulder surgery    Limitations  Sitting;Standing;Walking;House hold activities    Patient Stated Goals  walk without walker    Currently in Pain?  Yes    Pain Score  3     Pain Location  Knee    Pain Orientation  Left    Pain Descriptors / Indicators  Aching;Dull    Pain Type  Surgical pain    Pain Onset  1 to 4 weeks ago    Pain Frequency  Intermittent    Aggravating Factors   movement and not taking pain medication    Pain Relieving Factors  pain medication and rest    Effect of Pain on Daily Activities  difficulties with performing ADLS and home activities.         [facility] PT Assessment - 01/15/18 0001      Assessment   Medical Diagnosis  Left total knee replacement    Referring Provider  Cheral Almas    Onset Date/Surgical Date  12/27/17    Next MD Visit  February 10, 2018    Prior Therapy  yes, home health      Balance Screen   Has the patient fallen in the past 6 months  No    Has the patient had a decrease in activity level because of a  [facility] [place], [place], [address] Phone: [phone]   Fax:  [phone]  Physical Therapy Evaluation  Patient Details  Name: Cheryl Gonzalez Grade MRN: [id number] Date of Birth: [birth year] Referring Provider: Cheral Almas   Encounter Date: 01/15/2018  PT End of Session - 01/15/18 2203    Visit Number  1    Number of Visits  12    Date for PT Re-Evaluation  02/19/18    Authorization Type  FOTO every 5th visit, progress note every 10th visit, KX modifier at 15th visit    PT Start Time  0946    PT Stop Time  1033    PT Time Calculation (min)  47 min    Activity Tolerance  Patient tolerated treatment well    Behavior During Therapy  [facility] for tasks assessed/performed       Past Medical History:  Diagnosis Date  . Cigarette smoker   . History of meniscal tear     Past Surgical History:  Procedure Laterality Date  . JOINT REPLACEMENT    . KNEE SURGERY Right   . SHOULDER SURGERY Left     There were no vitals filed for this visit.   Subjective Assessment - 01/15/18 2148    Subjective  Patient arrives with husband with reports of left knee pain and difficulty walking due to a left total knee replacement on Dec 27, 2017. Patient states she has had 2 weeks of home health physical therapy and has been discharged from their care. Patient reports she has been compliant with her HEP and walks throughout her home without an AD. When she ambulates in the community, she uses her walker for safety. Patient reports pain at worst is 10/10 when she is not ahead of her pain medication. At best, she is a 0/10 with pain medication and rest. Patient has not been negotiating steps to the second floor where her bedroom is; patient reports she sleeps on the first floor. Patient's goals are to decrease pain, walk without an AD, and ambulate steps.    Patient is accompained by:  Family member husband    Pertinent History  right TKA, and previous  shoulder surgery    Limitations  Sitting;Standing;Walking;House hold activities    Patient Stated Goals  walk without walker    Currently in Pain?  Yes    Pain Score  3     Pain Location  Knee    Pain Orientation  Left    Pain Descriptors / Indicators  Aching;Dull    Pain Type  Surgical pain    Pain Onset  1 to 4 weeks ago    Pain Frequency  Intermittent    Aggravating Factors   movement and not taking pain medication    Pain Relieving Factors  pain medication and rest    Effect of Pain on Daily Activities  difficulties with performing ADLS and home activities.         [facility] PT Assessment - 01/15/18 0001      Assessment   Medical Diagnosis  Left total knee replacement    Referring Provider  Cheral Almas    Onset Date/Surgical Date  12/27/17    Next MD Visit  February 10, 2018    Prior Therapy  yes, home health      Balance Screen   Has the patient fallen in the past 6 months  No    Has the patient had a decrease in activity level because of a

## 2018-01-17 ENCOUNTER — Encounter: Payer: Self-pay | Admitting: Physical Therapy

## 2018-01-17 ENCOUNTER — Ambulatory Visit: Payer: Medicare Other | Admitting: Physical Therapy

## 2018-01-17 DIAGNOSIS — R262 Difficulty in walking, not elsewhere classified: Secondary | ICD-10-CM | POA: Diagnosis not present

## 2018-01-17 DIAGNOSIS — M25562 Pain in left knee: Secondary | ICD-10-CM

## 2018-01-17 DIAGNOSIS — M25662 Stiffness of left knee, not elsewhere classified: Secondary | ICD-10-CM | POA: Diagnosis not present

## 2018-01-17 NOTE — Therapy (Signed)
[facility] [address] [place], [place], [address] Phone: [phone]   Fax:  [phone]  Physical Therapy Treatment  Patient Details  Name: Cheryl Gonzalez MRN: [id number] Date of Birth: 11-28-1950 Referring Provider: Antonieta Iba   Encounter Date: 01/17/2018  PT End of Session - 01/17/18 1040    Visit Number  2    Number of Visits  12    Date for PT Re-Evaluation  02/19/18    Authorization Type  FOTO every 5th visit, progress note every 10th visit, KX modifier at 15th visit    PT Start Time  0900    PT Stop Time  1003    PT Time Calculation (min)  63 min    Activity Tolerance  Patient tolerated treatment well    Behavior During Therapy  [facility] for tasks assessed/performed       Past Medical History:  Diagnosis Date  . Cigarette smoker   . History of meniscal tear     Past Surgical History:  Procedure Laterality Date  . JOINT REPLACEMENT    . KNEE SURGERY Right   . SHOULDER SURGERY Left     There were no vitals filed for this visit.  Subjective Assessment - 01/17/18 1013    Subjective  No new complaints.    Currently in Pain?  Yes    Pain Score  3     Pain Location  Knee    Pain Orientation  Left    Pain Descriptors / Indicators  Aching;Dull    Pain Type  Surgical pain    Pain Onset  1 to 4 weeks ago                       [facility] Adult PT Treatment/Exercise - 01/17/18 0001      Exercises   Exercises  Knee/Hip      Knee/Hip Exercises: Aerobic   Nustep  Level 1 x 15 minutes moving forward x 2 to increase flexion.      Knee/Hip Exercises: Supine   Short Arc Quad Sets Limitations  SAQ's x 15 minutes facilitated with VMS to left quadriceps with 10 sec extension holds and 10 sec off      Modalities   Modalities  Electrical Stimulation;Vasopneumatic      Electrical Stimulation   Electrical Stimulation Location  Left knee.    Electrical Stimulation Action  IFC    Electrical Stimulation Parameters   80-150 Hz x 20 minutes.    Electrical Stimulation Goals  Edema;Pain               PT Short Term Goals - 01/15/18 2215      PT SHORT TERM GOAL #1   Title  Patient will improve left knee extension to 0 degrees to normalize gait pattern.    Time  2    Period  Weeks    Status  New      PT SHORT TERM GOAL #2   Title  Patient will ambulate 200 feet with straight cane or least restricted AD around clinic with minimal gait deviations    Time  2    Period  Weeks    Status  New        PT Long Term Goals - 01/15/18 2220      PT LONG TERM GOAL #1   Title  Patient will be independent with advanced HEP    Time  4    Period  Weeks  Status  New      PT LONG TERM GOAL #2   Title  Patient will improve left knee flexion to 115+ degrees to improve ability to perform functional activities.    Time  4    Period  Weeks    Status  New      PT LONG TERM GOAL #3   Title  Patient will demonstrate 4+/5- 5/5 strength in L LE to improve stability during functional tasks.    Time  4    Period  Weeks    Status  New      PT LONG TERM GOAL #4   Title  Patient will report ability to negotiate 16 steps with right railing with reciprocating pattern to safely access her bedroom.    Time  4    Period  Weeks    Status  New      PT LONG TERM GOAL #5   Title  Patient will ambulate community distances with less than 3/10 pain in L knee and no AD.    Time  4    Period  Weeks    Status  New            Plan - 01/17/18 1029    Clinical Impression Statement  The patient did great today.  Good left quadriceps activation with VMS.    PT Treatment/Interventions  ADLs/Self Care Home Management;Cryotherapy;Electrical Stimulation;Moist Heat;Gait training;Stair training;Functional mobility training;Therapeutic activities;Therapeutic exercise;Balance training;Patient/family education;Neuromuscular re-education;Manual techniques;Vasopneumatic Device;Taping;Passive range of motion    PT Next Visit  Plan  nustep, ROM, gait training with a cane, modalites PRN for pain relief    PT Home Exercise Plan  cont. exercises provided by home health PT    Consulted and Agree with Plan of Care  Patient       Patient will benefit from skilled therapeutic intervention in order to improve the following deficits and impairments:  Pain, Decreased activity tolerance, Decreased endurance, Decreased range of motion, Decreased strength, Decreased balance, Difficulty walking, Increased edema  Visit Diagnosis: Acute pain of left knee  Stiffness of left knee, not elsewhere classified  Difficulty in walking, not elsewhere classified     Problem List Patient Active Problem List   Diagnosis Date Noted  . Tobacco abuse 11/04/2017  . Primary osteoarthritis of left knee 10/29/2017  . Allergic rhinitis 03/28/2005  . Sensorineural hearing loss 03/28/2005    Teiana Hajduk, [place] MPT 01/17/2018, 10:41 AM  [facility] [address] [place], [place], [address] Phone: [phone]   Fax:  [phone]  Name: Endora Agyemang MRN: [id number] Date of Birth: 12-04-1950

## 2018-01-20 ENCOUNTER — Encounter: Payer: Self-pay | Admitting: Physical Therapy

## 2018-01-20 ENCOUNTER — Ambulatory Visit: Payer: Medicare Other | Admitting: Physical Therapy

## 2018-01-20 DIAGNOSIS — R262 Difficulty in walking, not elsewhere classified: Secondary | ICD-10-CM | POA: Diagnosis not present

## 2018-01-20 DIAGNOSIS — M25662 Stiffness of left knee, not elsewhere classified: Secondary | ICD-10-CM

## 2018-01-20 DIAGNOSIS — M25562 Pain in left knee: Secondary | ICD-10-CM | POA: Diagnosis not present

## 2018-01-20 NOTE — Therapy (Signed)
Problem List Patient Active Problem List   Diagnosis Date Noted  . Tobacco abuse 11/04/2017  . Primary osteoarthritis of left knee  10/29/2017  . Allergic rhinitis 03/28/2005  . Sensorineural hearing loss 03/28/2005   Gabriela Eves, PT, DPT 01/20/2018, 10:17 AM  [facility] [address] [place], [place], [address] Phone: [phone]   Fax:  [phone]  Name: Cheryl Gonzalez MRN: [id number] Date of Birth: 1950-10-16  Problem List Patient Active Problem List   Diagnosis Date Noted  . Tobacco abuse 11/04/2017  . Primary osteoarthritis of left knee  10/29/2017  . Allergic rhinitis 03/28/2005  . Sensorineural hearing loss 03/28/2005   Gabriela Eves, PT, DPT 01/20/2018, 10:17 AM  [facility] [address] [place], [place], [address] Phone: [phone]   Fax:  [phone]  Name: Cheryl Gonzalez MRN: [id number] Date of Birth: 1950-10-16  [facility] [place], [place], [address] Phone: [phone]   Fax:  [phone]  Physical Therapy Treatment  Patient Details  Name: Cheryl Gonzalez MRN: [id number] Date of Birth: 21-Mar-1951 Referring Provider: Cheral Almas   Encounter Date: 01/20/2018  PT End of Session - 01/20/18 0905    Visit Number  3    Number of Visits  12    Date for PT Re-Evaluation  02/19/18    Authorization Type  FOTO every 5th visit, progress note every 10th visit, KX modifier at 15th visit    PT Start Time  0901    PT Stop Time  0958    PT Time Calculation (min)  57 min    Activity Tolerance  Patient tolerated treatment well    Behavior During Therapy  [facility] for tasks assessed/performed       Past Medical History:  Diagnosis Date  . Cigarette smoker   . History of meniscal tear     Past Surgical History:  Procedure Laterality Date  . JOINT REPLACEMENT    . KNEE SURGERY Right   . SHOULDER SURGERY Left     There were no vitals filed for this visit.  Subjective Assessment - 01/20/18 1009    Subjective  Patient reported pain in left knee is 2/10.     Pertinent History  right TKA, and previous shoulder surgery    Limitations  Sitting;Standing;Walking;House hold activities    Patient Stated Goals  walk without walker    Currently in Pain?  Yes    Pain Score  2     Pain Orientation  Left    Pain Descriptors / Indicators  Aching;Dull    Pain Type  Surgical pain    Pain Onset  1 to 4 weeks ago    Pain Frequency  Intermittent         [facility] PT Assessment - 01/20/18 0001      Assessment   Medical Diagnosis  Left total knee replacement    Next MD Visit  February 10, 2018    Prior Therapy  yes, home health                   [facility] Adult PT Treatment/Exercise - 01/20/18 0001      Exercises   Exercises  Knee/Hip      Knee/Hip Exercises: Aerobic   Stationary Bike  AAROM x7 minutes    Nustep  Level 1 x 8 minutes      Knee/Hip  Exercises: Seated   Long Arc Quad  AROM;Left;1 set;20 reps      Knee/Hip Exercises: Supine   Heel Slides  AROM;Left;2 sets;10 reps      Modalities   Modalities  Health visitor Stimulation Location  Left knee.    Electrical Stimulation Action  IFC    Electrical Stimulation Parameters  80-150 hz x15 min    Electrical Stimulation Goals  Edema;Pain      Vasopneumatic   Number Minutes Vasopneumatic   15 minutes    Vasopnuematic Location   Knee    Vasopneumatic Pressure  Low    Vasopneumatic Temperature   34      Manual Therapy   Manual Therapy  Passive ROM;Joint mobilization    Joint Mobilization  Patella mobs in all planes to improve ROM    Passive ROM  PROM with gentle overpressure to left knee to increase flexion and extension

## 2018-01-22 ENCOUNTER — Ambulatory Visit: Payer: Medicare Other | Admitting: Physical Therapy

## 2018-01-22 DIAGNOSIS — M25562 Pain in left knee: Secondary | ICD-10-CM | POA: Diagnosis not present

## 2018-01-22 DIAGNOSIS — R262 Difficulty in walking, not elsewhere classified: Secondary | ICD-10-CM

## 2018-01-22 DIAGNOSIS — M25662 Stiffness of left knee, not elsewhere classified: Secondary | ICD-10-CM

## 2018-01-22 NOTE — Therapy (Signed)
Tobacco abuse 11/04/2017  . Primary osteoarthritis of left knee 10/29/2017  . Allergic rhinitis 03/28/2005  .  Sensorineural hearing loss 03/28/2005   Gabriela Eves, PT, DPT 01/22/2018, 10:17 AM  [facility] [address] [place], [place], [address] Phone: [phone]   Fax:  [phone]  Name: Ninoska Goswick MRN: [id number] Date of Birth: March 21, 1951  Tobacco abuse 11/04/2017  . Primary osteoarthritis of left knee 10/29/2017  . Allergic rhinitis 03/28/2005  .  Sensorineural hearing loss 03/28/2005   Gabriela Eves, PT, DPT 01/22/2018, 10:17 AM  [facility] [address] [place], [place], [address] Phone: [phone]   Fax:  [phone]  Name: Ninoska Goswick MRN: [id number] Date of Birth: March 21, 1951  [facility] [place], [place], [address] Phone: [phone]   Fax:  [phone]  Physical Therapy Treatment  Patient Details  Name: Talma Aguillard MRN: [id number] Date of Birth: 1951-02-15 Referring Provider: Cheral Almas   Encounter Date: 01/22/2018  PT End of Session - 01/22/18 0903    Visit Number  4    Number of Visits  12    Date for PT Re-Evaluation  02/19/18    Authorization Type  FOTO every 5th visit, progress note every 10th visit, KX modifier at 15th visit    PT Start Time  0900    PT Stop Time  1000    PT Time Calculation (min)  60 min    Activity Tolerance  Patient tolerated treatment well    Behavior During Therapy  [facility] for tasks assessed/performed       Past Medical History:  Diagnosis Date  . Cigarette smoker   . History of meniscal tear     Past Surgical History:  Procedure Laterality Date  . JOINT REPLACEMENT    . KNEE SURGERY Right   . SHOULDER SURGERY Left     There were no vitals filed for this visit.  Subjective Assessment - 01/22/18 0903    Subjective  Patient reports feeling fine with no reports of pain right now.    Pertinent History  right TKA, and previous shoulder surgery    Limitations  Sitting;Standing;Walking;House hold activities    Patient Stated Goals  walk without walker    Currently in Pain?  No/denies         [facility] PT Assessment - 01/22/18 0001      Assessment   Medical Diagnosis  Left total knee replacement    Next MD Visit  February 10, 2018    Prior Therapy  yes, home health      PROM   Left Knee Flexion  104                   [facility] Adult PT Treatment/Exercise - 01/22/18 0001      Exercises   Exercises  Knee/Hip      Knee/Hip Exercises: Stretches   Knee: Self-Stretch to increase Flexion  Left 5" hold x 20       Knee/Hip Exercises: Aerobic   Stationary Bike  AAROM x5 minutes    Nustep  Level 2 x10 minutes moving seat forward to improve flexion      Knee/Hip Exercises: Supine   Straight Leg Raises  Strengthening;Both;1 set;10 reps      Modalities   Modalities  Electrical Stimulation;Vasopneumatic      Electrical Stimulation   Electrical Stimulation Location  Left knee.    Chartered certified accountant  IFC    Electrical Stimulation Parameters  80-150 hz x15    Electrical Stimulation Goals  Edema;Pain      Vasopneumatic   Number Minutes Vasopneumatic   15 minutes    Vasopnuematic Location   Knee    Vasopneumatic Pressure  Low    Vasopneumatic Temperature   36      Manual Therapy   Manual Therapy  Passive ROM;Joint mobilization    Joint Mobilization  --    Passive ROM  PROM flexion and extension with gentle overpressure to left knee to increase flexion and extension               PT Short Term Goals - 01/15/18 2215      PT SHORT TERM GOAL #1

## 2018-01-24 ENCOUNTER — Encounter: Payer: Self-pay | Admitting: Physical Therapy

## 2018-01-24 ENCOUNTER — Ambulatory Visit: Payer: Medicare Other | Admitting: Physical Therapy

## 2018-01-24 DIAGNOSIS — M25562 Pain in left knee: Secondary | ICD-10-CM | POA: Diagnosis not present

## 2018-01-24 DIAGNOSIS — M25662 Stiffness of left knee, not elsewhere classified: Secondary | ICD-10-CM | POA: Diagnosis not present

## 2018-01-24 DIAGNOSIS — R262 Difficulty in walking, not elsewhere classified: Secondary | ICD-10-CM

## 2018-01-24 NOTE — Therapy (Signed)
[facility] [address] [place], [place], [address] Phone: [phone]   Fax:  [phone]  Physical Therapy Treatment  Patient Details  Name: Cheryl Gonzalez MRN: [id number] Date of Birth: 01-12-1951 Referring Provider: Antonieta Iba   Encounter Date: 01/24/2018  PT End of Session - 01/24/18 0922    Visit Number  5    Number of Visits  12    Date for PT Re-Evaluation  02/19/18    Authorization Type  FOTO every 5th visit, progress note every 10th visit, KX modifier at 15th visit    PT Start Time  0905    PT Stop Time  0956    PT Time Calculation (min)  51 min    Activity Tolerance  Patient tolerated treatment well    Behavior During Therapy  [facility] for tasks assessed/performed       Past Medical History:  Diagnosis Date  . Cigarette smoker   . History of meniscal tear     Past Surgical History:  Procedure Laterality Date  . JOINT REPLACEMENT    . KNEE SURGERY Right   . SHOULDER SURGERY Left     There were no vitals filed for this visit.  Subjective Assessment - 01/24/18 0906    Subjective  Patient reported feeling sore from last visit. Patient stated taking a pain pill.    Pertinent History  right TKA, and previous shoulder surgery    Limitations  Sitting;Standing;Walking;House hold activities    Patient Stated Goals  walk without walker    Currently in Pain?  Yes    Pain Score  4     Pain Location  Knee    Pain Orientation  Left    Pain Descriptors / Indicators  Aching;Dull    Pain Type  Surgical pain    Pain Onset  1 to 4 weeks ago         [facility] PT Assessment - 01/24/18 0001      Assessment   Medical Diagnosis  Left total knee replacement    Next MD Visit  February 10, 2018    Prior Therapy  yes, home health                   [facility] Adult PT Treatment/Exercise - 01/24/18 0001      Exercises   Exercises  Knee/Hip      Knee/Hip Exercises: Aerobic   Stationary Bike  AAROM x12 minutes seat 6    Nustep   Level 2 x5 minutes       Knee/Hip Exercises: Standing   Hip Abduction  AROM;Both;1 set;20 reps;Knee straight    Rocker Board  3 minutes      Modalities   Modalities  Electrical Stimulation;Vasopneumatic      Electrical Stimulation   Electrical Stimulation Location  Left knee.    Electrical Stimulation Action  IFC    Electrical Stimulation Parameters  80-150 hz x15 min    Electrical Stimulation Goals  Edema;Pain      Vasopneumatic   Number Minutes Vasopneumatic   15 minutes    Vasopnuematic Location   Knee    Vasopneumatic Pressure  Low    Vasopneumatic Temperature   36      Manual Therapy   Manual Therapy  Passive ROM;Joint mobilization    Joint Mobilization  Patella mobs in superior and inferior direction to improve ROM    Passive ROM  PROM into extension with gentle overpressure to improve ROM  PT Short Term Goals - 01/15/18 2215      PT SHORT TERM GOAL #1   Title  Patient will improve left knee extension to 0 degrees to normalize gait pattern.    Time  2    Period  Weeks    Status  New      PT SHORT TERM GOAL #2   Title  Patient will ambulate 200 feet with straight cane or least restricted AD around clinic with minimal gait deviations    Time  2    Period  Weeks    Status  New        PT Long Term Goals - 01/15/18 2220      PT LONG TERM GOAL #1   Title  Patient will be independent with advanced HEP    Time  4    Period  Weeks    Status  New      PT LONG TERM GOAL #2   Title  Patient will improve left knee flexion to 115+ degrees to improve ability to perform functional activities.    Time  4    Period  Weeks    Status  New      PT LONG TERM GOAL #3   Title  Patient will demonstrate 4+/5- 5/5 strength in L LE to improve stability during functional tasks.    Time  4    Period  Weeks    Status  New      PT LONG TERM GOAL #4   Title  Patient will report ability to negotiate 16 steps with right railing with reciprocating pattern to  safely access her bedroom.    Time  4    Period  Weeks    Status  New      PT LONG TERM GOAL #5   Title  Patient will ambulate community distances with less than 3/10 pain in L knee and no AD.    Time  4    Period  Weeks    Status  New            Plan - 01/24/18 1028    Clinical Impression Statement  Patient was able to tolerate treatment well and was able to perform full revolutions after warming up. Patient noted with some muscle fatigue with hip abduction and required verbal cues for proper form. Patient was able to demonstrate good form after cuing. Normal response to modalities at end of session.     Clinical Presentation  Stable    Clinical Decision Making  Low    Rehab Potential  Good    PT Frequency  3x / week    PT Duration  4 weeks    PT Treatment/Interventions  ADLs/Self Care Home Management;Cryotherapy;Electrical Stimulation;Moist Heat;Gait training;Stair training;Functional mobility training;Therapeutic activities;Therapeutic exercise;Balance training;Patient/family education;Neuromuscular re-education;Manual techniques;Vasopneumatic Device;Taping;Passive range of motion    PT Next Visit Plan  Assess goals next visit; nustep, ROM, gait training with a cane, modalites PRN for pain relief    PT Home Exercise Plan  heel prop with weight    Consulted and Agree with Plan of Care  Patient       Patient will benefit from skilled therapeutic intervention in order to improve the following deficits and impairments:  Pain, Decreased activity tolerance, Decreased endurance, Decreased range of motion, Decreased strength, Decreased balance, Difficulty walking, Increased edema  Visit Diagnosis: Acute pain of left knee  Stiffness of left knee, not elsewhere classified  Difficulty in walking, not elsewhere classified  Problem List Patient Active Problem List   Diagnosis Date Noted  . Tobacco abuse 11/04/2017  . Primary osteoarthritis of left knee 10/29/2017  . Allergic  rhinitis 03/28/2005  . Sensorineural hearing loss 03/28/2005   Guss Bunde, PT, DPT 01/24/2018, 12:28 PM  [facility] [address] [place], [place], [address] Phone: [phone]   Fax:  [phone]  Name: Cheryl Gonzalez MRN: [id number] Date of Birth: 1951/07/11

## 2018-01-27 ENCOUNTER — Encounter: Payer: Self-pay | Admitting: Physical Therapy

## 2018-01-27 ENCOUNTER — Ambulatory Visit: Payer: Medicare Other | Attending: General Surgery | Admitting: Physical Therapy

## 2018-01-27 DIAGNOSIS — M25662 Stiffness of left knee, not elsewhere classified: Secondary | ICD-10-CM | POA: Diagnosis not present

## 2018-01-27 DIAGNOSIS — M25562 Pain in left knee: Secondary | ICD-10-CM | POA: Diagnosis not present

## 2018-01-27 DIAGNOSIS — R262 Difficulty in walking, not elsewhere classified: Secondary | ICD-10-CM

## 2018-01-27 NOTE — Therapy (Signed)
[facility] [address] [place], [place], [address] Phone: [phone]   Fax:  [phone]  Physical Therapy Treatment  Patient Details  Name: Cheryl Gonzalez MRN: [id number] Date of Birth: 11-25-1950 Referring Provider: Antonieta Iba   Encounter Date: 01/27/2018  PT End of Session - 01/27/18 1519    Visit Number  6    Number of Visits  12    Date for PT Re-Evaluation  02/19/18    Authorization Type  FOTO every 5th visit, progress note every 10th visit, KX modifier at 15th visit    PT Start Time  1514    PT Stop Time  1607    PT Time Calculation (min)  53 min    Activity Tolerance  Patient tolerated treatment well    Behavior During Therapy  [facility] for tasks assessed/performed       Past Medical History:  Diagnosis Date  . Cigarette smoker   . History of meniscal tear     Past Surgical History:  Procedure Laterality Date  . JOINT REPLACEMENT    . KNEE SURGERY Right   . SHOULDER SURGERY Left     There were no vitals filed for this visit.  Subjective Assessment - 01/27/18 1515    Subjective  Reports increased soreness.    Pertinent History  right TKA, and previous shoulder surgery    Limitations  Sitting;Standing;Walking;House hold activities    Patient Stated Goals  walk without walker    Currently in Pain?  Yes    Pain Score  2     Pain Location  Knee    Pain Orientation  Left    Pain Descriptors / Indicators  Sore    Pain Type  Surgical pain    Pain Onset  1 to 4 weeks ago         [facility] PT Assessment - 01/27/18 0001      Assessment   Medical Diagnosis  Left total knee replacement    Onset Date/Surgical Date  12/27/17    Next MD Visit  02/10/2018    Prior Therapy  yes, home health                   [facility] Adult PT Treatment/Exercise - 01/27/18 0001      Knee/Hip Exercises: Aerobic   Stationary Bike  x15 min      Knee/Hip Exercises: Standing   Forward Lunges  Left;2 sets;10 reps;2 seconds    Hip  Abduction  AROM;Both;1 set;20 reps;Knee straight    Rocker Board  3 minutes      Knee/Hip Exercises: Supine   Short Arc Quad Sets  AROM;Left;3 sets;10 reps    Straight Leg Raises  AROM;Left;2 sets;10 reps      Modalities   Modalities  Programmer, applications Location  L knee    Electrical Stimulation Action  IFC    Electrical Stimulation Parameters  80-150 hz x15 min    Electrical Stimulation Goals  Edema;Pain      Vasopneumatic   Number Minutes Vasopneumatic   15 minutes    Vasopnuematic Location   Knee    Vasopneumatic Pressure  Low    Vasopneumatic Temperature   36      Manual Therapy   Manual Therapy  Passive ROM    Passive ROM  PROM of L knee into flexion, extension with gentle holds at end range  PT Short Term Goals - 01/27/18 1620      PT SHORT TERM GOAL #1   Title  Patient will improve left knee extension to 0 degrees to normalize gait pattern.    Time  2    Period  Weeks    Status  On-going      PT SHORT TERM GOAL #2   Title  Patient will ambulate 200 feet with straight cane or least restricted AD around clinic with minimal gait deviations    Time  2    Period  Weeks    Status  On-going        PT Long Term Goals - 01/27/18 1620      PT LONG TERM GOAL #1   Title  Patient will be independent with advanced HEP    Time  4    Period  Weeks    Status  On-going      PT LONG TERM GOAL #2   Title  Patient will improve left knee flexion to 115+ degrees to improve ability to perform functional activities.    Time  4    Period  Weeks    Status  On-going      PT LONG TERM GOAL #3   Title  Patient will demonstrate 4+/5- 5/5 strength in L LE to improve stability during functional tasks.    Time  4    Period  Weeks    Status  On-going      PT LONG TERM GOAL #4   Title  Patient will report ability to negotiate 16 steps with right railing with reciprocating pattern to safely  access her bedroom.    Time  4    Period  Weeks    Status  On-going      PT LONG TERM GOAL #5   Title  Patient will ambulate community distances with less than 3/10 pain in L knee and no AD.    Time  4    Period  Weeks    Status  On-going            Plan - 01/27/18 1614    Clinical Impression Statement  Patient did fairly well during today's treatment and only reported minimal L knee pain. Patient able to complete exercises fairly well but requires warm up from partial revolutions to full revolutions on stationary bike. Patient encouraged to complete quad strengthening exercises such as SAQ and SLR with L ankle DF to further engage quad. Normal modalities response noted following removal of the modalities.    Rehab Potential  Good    PT Frequency  3x / week    PT Duration  4 weeks    PT Treatment/Interventions  ADLs/Self Care Home Management;Cryotherapy;Electrical Stimulation;Moist Heat;Gait training;Stair training;Functional mobility training;Therapeutic activities;Therapeutic exercise;Balance training;Patient/family education;Neuromuscular re-education;Manual techniques;Vasopneumatic Device;Taping;Passive range of motion    PT Next Visit Plan  Assess goals next visit; nustep, ROM, gait training with a cane, modalites PRN for pain relief    PT Home Exercise Plan  heel prop with weight    Consulted and Agree with Plan of Care  Patient       Patient will benefit from skilled therapeutic intervention in order to improve the following deficits and impairments:  Pain, Decreased activity tolerance, Decreased endurance, Decreased range of motion, Decreased strength, Decreased balance, Difficulty walking, Increased edema  Visit Diagnosis: Acute pain of left knee  Stiffness of left knee, not elsewhere classified  Difficulty in walking, not elsewhere classified  Problem List Patient Active Problem List   Diagnosis Date Noted  . Tobacco abuse 11/04/2017  . Primary  osteoarthritis of left knee 10/29/2017  . Allergic rhinitis 03/28/2005  . Sensorineural hearing loss 03/28/2005    Marvell Fuller, PTA 01/27/2018, 4:21 PM  [facility] [address] [place], [place], [address] Phone: [phone]   Fax:  [phone]  Name: Tamah Pagliuca MRN: [id number] Date of Birth: Sep 01, 1950

## 2018-01-29 ENCOUNTER — Ambulatory Visit: Payer: Medicare Other | Admitting: Physical Therapy

## 2018-01-29 DIAGNOSIS — R262 Difficulty in walking, not elsewhere classified: Secondary | ICD-10-CM

## 2018-01-29 DIAGNOSIS — M25662 Stiffness of left knee, not elsewhere classified: Secondary | ICD-10-CM | POA: Diagnosis not present

## 2018-01-29 DIAGNOSIS — M25562 Pain in left knee: Secondary | ICD-10-CM

## 2018-01-29 NOTE — Therapy (Addendum)
[facility] [address] [place], [place], [address] Phone: [phone]   Fax:  [phone]  Physical Therapy Treatment  Patient Details  Name: Jateria Francesco MRN: [id number] Date of Birth: 1950-10-09 Referring Provider: Antonieta Iba   Encounter Date: 01/29/2018  PT End of Session - 01/29/18 0944    Visit Number  7    Number of Visits  12    Date for PT Re-Evaluation  02/19/18    Authorization Type  FOTO every 5th visit, progress note every 10th visit, KX modifier at 15th visit    PT Start Time  0900    PT Stop Time  0958    PT Time Calculation (min)  58 min    Activity Tolerance  Patient tolerated treatment well    Behavior During Therapy  [facility] for tasks assessed/performed       Past Medical History:  Diagnosis Date  . Cigarette smoker   . History of meniscal tear     Past Surgical History:  Procedure Laterality Date  . JOINT REPLACEMENT    . KNEE SURGERY Right   . SHOULDER SURGERY Left     There were no vitals filed for this visit.  Subjective Assessment - 01/29/18 0944    Subjective  "If I could get rid of the stiffness I would  be 100%"    Pertinent History  right TKA, and previous shoulder surgery    Limitations  Sitting;Standing;Walking;House hold activities    Patient Stated Goals  walk without walker    Currently in Pain?  Yes    Pain Score  2     Pain Location  Knee    Pain Orientation  Left    Pain Descriptors / Indicators  Sore    Pain Type  Surgical pain    Pain Onset  More than a month ago    Pain Frequency  Intermittent         [facility] PT Assessment - 01/29/18 0001      Assessment   Medical Diagnosis  Left total knee replacement    Onset Date/Surgical Date  12/27/17    Next MD Visit  02/10/2018    Prior Therapy  yes, home health      PROM   Left Knee Extension  3    Left Knee Flexion  100                   [facility] Adult PT Treatment/Exercise - 01/29/18 0001      Ambulation/Gait    Stairs  Yes    Stairs Assistance  5: Supervision    Stairs Assistance Details (indicate cue type and reason)  close supervision during eccentric step down    Stair Management Technique  One rail Right;Step to pattern;Alternating pattern;Forwards    Number of Stairs  4    Height of Stairs  6.5    Door Management  --      Exercises   Exercises  Knee/Hip      Knee/Hip Exercises: Aerobic   Stationary Bike  x15 min      Knee/Hip Exercises: Standing   Step Down  Left;1 set;10 reps;Hand Hold: 2;Step Height: 4"    Rocker Board  3 minutes      Modalities   Modalities  Electrical Stimulation;Vasopneumatic      Manual Therapy   Manual Therapy  Passive ROM    Passive ROM  PROM of L knee into flexion, extension with gentle holds at end range, Contract  relax to improve ROM        IFC to L knee 80-150 hz x15 min for edema and pain  Vasopneumatic device to L knee low pressure 34 degrees        PT Short Term Goals - 01/27/18 1620      PT SHORT TERM GOAL #1   Title  Patient will improve left knee extension to 0 degrees to normalize gait pattern.    Time  2    Period  Weeks    Status  On-going      PT SHORT TERM GOAL #2   Title  Patient will ambulate 200 feet with straight cane or least restricted AD around clinic with minimal gait deviations    Time  2    Period  Weeks    Status  On-going        PT Long Term Goals - 01/27/18 1620      PT LONG TERM GOAL #1   Title  Patient will be independent with advanced HEP    Time  4    Period  Weeks    Status  On-going      PT LONG TERM GOAL #2   Title  Patient will improve left knee flexion to 115+ degrees to improve ability to perform functional activities.    Time  4    Period  Weeks    Status  On-going      PT LONG TERM GOAL #3   Title  Patient will demonstrate 4+/5- 5/5 strength in L LE to improve stability during functional tasks.    Time  4    Period  Weeks    Status  On-going      PT LONG TERM GOAL #4   Title   Patient will report ability to negotiate 16 steps with right railing with reciprocating pattern to safely access her bedroom.    Time  4    Period  Weeks    Status  On-going      PT LONG TERM GOAL #5   Title  Patient will ambulate community distances with less than 3/10 pain in L knee and no AD.    Time  4    Period  Weeks    Status  On-going            Plan - 01/29/18 1029    Clinical Impression Statement  Patient was able to tolerate well with minimal reports of pain, just stiffness. Patient demonstrates decreased eccentric control with step downs and noted with fatigue after exercise. Normal response to modalities upon removal.     Clinical Presentation  Stable    Clinical Decision Making  Low    Rehab Potential  Good    PT Frequency  3x / week    PT Duration  4 weeks    PT Treatment/Interventions  ADLs/Self Care Home Management;Cryotherapy;Electrical Stimulation;Moist Heat;Gait training;Stair training;Functional mobility training;Therapeutic activities;Therapeutic exercise;Balance training;Patient/family education;Neuromuscular re-education;Manual techniques;Vasopneumatic Device;Taping;Passive range of motion    PT Next Visit Plan  nustep, ROM, gait training without a cane, quad strengthening modalites PRN for pain relief    Consulted and Agree with Plan of Care  Patient       Patient will benefit from skilled therapeutic intervention in order to improve the following deficits and impairments:  Pain, Decreased activity tolerance, Decreased endurance, Decreased range of motion, Decreased strength, Decreased balance, Difficulty walking, Increased edema  Visit Diagnosis: Acute pain of left knee  Stiffness of left knee, not  elsewhere classified  Difficulty in walking, not elsewhere classified     Problem List Patient Active Problem List   Diagnosis Date Noted  . Tobacco abuse 11/04/2017  . Primary osteoarthritis of left knee 10/29/2017  . Allergic rhinitis 03/28/2005   . Sensorineural hearing loss 03/28/2005   Guss Bunde, PT, DPT 01/29/2018, 12:53 PM  [facility] [address] [place], [place], [address] Phone: [phone]   Fax:  [phone]  Name: Dannyelle Groves MRN: [id number] Date of Birth: 1950-11-16

## 2018-01-31 ENCOUNTER — Ambulatory Visit: Payer: Medicare Other | Admitting: Physical Therapy

## 2018-01-31 ENCOUNTER — Encounter: Payer: Self-pay | Admitting: Physical Therapy

## 2018-01-31 DIAGNOSIS — R262 Difficulty in walking, not elsewhere classified: Secondary | ICD-10-CM | POA: Diagnosis not present

## 2018-01-31 DIAGNOSIS — M25662 Stiffness of left knee, not elsewhere classified: Secondary | ICD-10-CM

## 2018-01-31 DIAGNOSIS — M25562 Pain in left knee: Secondary | ICD-10-CM | POA: Diagnosis not present

## 2018-01-31 NOTE — Therapy (Signed)
[facility] [address] [place], [place], [address] Phone: [phone]   Fax:  [phone]  Physical Therapy Treatment  Patient Details  Name: Cheryl Gonzalez MRN: [id number] Date of Birth: 1950-09-12 Referring Provider: Antonieta Iba   Encounter Date: 01/31/2018  PT End of Session - 01/31/18 1058    Visit Number  8    Number of Visits  12    Date for PT Re-Evaluation  02/19/18    Authorization Type  FOTO every 5th visit, progress note every 10th visit, KX modifier at 15th visit    PT Start Time  0945    PT Stop Time  1046    PT Time Calculation (min)  61 min    Activity Tolerance  Patient tolerated treatment well    Behavior During Therapy  [facility] for tasks assessed/performed       Past Medical History:  Diagnosis Date  . Cigarette smoker   . History of meniscal tear     Past Surgical History:  Procedure Laterality Date  . JOINT REPLACEMENT    . KNEE SURGERY Right   . SHOULDER SURGERY Left     There were no vitals filed for this visit.  Subjective Assessment - 01/31/18 1059    Subjective  "I'm not too bad today."     Pertinent History  right TKA, and previous shoulder surgery    Limitations  Sitting;Standing;Walking;House hold activities    Patient Stated Goals  walk without walker    Currently in Pain?  No/denies         [facility] PT Assessment - 01/31/18 0001      Assessment   Medical Diagnosis  Left total knee replacement    Onset Date/Surgical Date  12/27/17    Next MD Visit  02/10/2018    Prior Therapy  yes, home health                   [facility] Adult PT Treatment/Exercise - 01/31/18 0001      Exercises   Exercises  Knee/Hip      Knee/Hip Exercises: Aerobic   Stationary Bike  x9 min    Nustep  Level 2 x6 minutes       Knee/Hip Exercises: Standing   Rocker Board  --      Knee/Hip Exercises: Seated   Long Arc Quad  Strengthening;Left;3 sets;10 reps;Weights    Long Arc Quad Weight  2 lbs.     Hamstring Curl  Strengthening;2 sets;10 reps    Hamstring Limitations  red theraband      Knee/Hip Exercises: Supine   Straight Leg Raise with External Rotation  --      Modalities   Modalities  Electrical Stimulation;Vasopneumatic      Electrical Stimulation   Electrical Stimulation Location  L knee    Electrical Stimulation Action  IFC    Electrical Stimulation Parameters  80-150 hz x15 min    Electrical Stimulation Goals  Edema;Pain      Vasopneumatic   Number Minutes Vasopneumatic   15 minutes    Vasopnuematic Location   Knee    Vasopneumatic Pressure  Low    Vasopneumatic Temperature   36      Manual Therapy   Manual Therapy  Passive ROM    Passive ROM  PROM of L knee into flexion, extension with gentle holds at end range, Contract relax to improve ROM               PT Short  Term Goals - 01/27/18 1620      PT SHORT TERM GOAL #1   Title  Patient will improve left knee extension to 0 degrees to normalize gait pattern.    Time  2    Period  Weeks    Status  On-going      PT SHORT TERM GOAL #2   Title  Patient will ambulate 200 feet with straight cane or least restricted AD around clinic with minimal gait deviations    Time  2    Period  Weeks    Status  On-going        PT Long Term Goals - 01/27/18 1620      PT LONG TERM GOAL #1   Title  Patient will be independent with advanced HEP    Time  4    Period  Weeks    Status  On-going      PT LONG TERM GOAL #2   Title  Patient will improve left knee flexion to 115+ degrees to improve ability to perform functional activities.    Time  4    Period  Weeks    Status  On-going      PT LONG TERM GOAL #3   Title  Patient will demonstrate 4+/5- 5/5 strength in L LE to improve stability during functional tasks.    Time  4    Period  Weeks    Status  On-going      PT LONG TERM GOAL #4   Title  Patient will report ability to negotiate 16 steps with right railing with reciprocating pattern to safely access  her bedroom.    Time  4    Period  Weeks    Status  On-going      PT LONG TERM GOAL #5   Title  Patient will ambulate community distances with less than 3/10 pain in L knee and no AD.    Time  4    Period  Weeks    Status  On-going            Plan - 01/31/18 1136    Clinical Impression Statement  Patient was able to tolerate treatment well with minimal reports of pain. Patient continues to demonstrate hip hiking compensation during PROM in sitting but compensation decreased with PT tactile cue on hip. No adverse affects noted upon removal.     Clinical Presentation  Stable    Clinical Decision Making  Low    Rehab Potential  Good    PT Frequency  3x / week    PT Duration  4 weeks    PT Treatment/Interventions  ADLs/Self Care Home Management;Cryotherapy;Electrical Stimulation;Moist Heat;Gait training;Stair training;Functional mobility training;Therapeutic activities;Therapeutic exercise;Balance training;Patient/family education;Neuromuscular re-education;Manual techniques;Vasopneumatic Device;Taping;Passive range of motion    PT Next Visit Plan  nustep, ROM, gait training without a cane, quad strengthening modalites PRN for pain relief    Consulted and Agree with Plan of Care  Patient       Patient will benefit from skilled therapeutic intervention in order to improve the following deficits and impairments:  Pain, Decreased activity tolerance, Decreased endurance, Decreased range of motion, Decreased strength, Decreased balance, Difficulty walking, Increased edema  Visit Diagnosis: Acute pain of left knee  Stiffness of left knee, not elsewhere classified  Difficulty in walking, not elsewhere classified     Problem List Patient Active Problem List   Diagnosis Date Noted  . Tobacco abuse 11/04/2017  . Primary osteoarthritis of left knee 10/29/2017  .  Allergic rhinitis 03/28/2005  . Sensorineural hearing loss 03/28/2005   Guss Bunde, PT, DPT 01/31/2018, 11:49  AM  [facility] [address] [place], [place], [address] Phone: [phone]   Fax:  [phone]  Name: Feona Kuethe MRN: [id number] Date of Birth: 11-Dec-1950

## 2018-02-03 ENCOUNTER — Ambulatory Visit: Payer: Medicare Other | Admitting: Physical Therapy

## 2018-02-03 DIAGNOSIS — M25662 Stiffness of left knee, not elsewhere classified: Secondary | ICD-10-CM | POA: Diagnosis not present

## 2018-02-03 DIAGNOSIS — M25562 Pain in left knee: Secondary | ICD-10-CM | POA: Diagnosis not present

## 2018-02-03 DIAGNOSIS — R262 Difficulty in walking, not elsewhere classified: Secondary | ICD-10-CM

## 2018-02-03 NOTE — Therapy (Signed)
[facility] [place], [place], [address] Phone: [phone]   Fax:  [phone]  Physical Therapy Treatment  Patient Details  Name: Cheryl Gonzalez MRN: [id number] Date of Birth: 1950-09-26 Referring Provider: Cheral Almas   Encounter Date: 02/03/2018  PT End of Session - 02/03/18 1051    Visit Number  9    Number of Visits  12    Date for PT Re-Evaluation  02/19/18    Authorization Type  FOTO every 5th visit, progress note every 10th visit, KX modifier at 15th visit    PT Start Time  0900    PT Stop Time  0958    PT Time Calculation (min)  58 min    Activity Tolerance  Patient tolerated treatment well    Behavior During Therapy  [facility] for tasks assessed/performed       Past Medical History:  Diagnosis Date  . Cigarette smoker   . History of meniscal tear     Past Surgical History:  Procedure Laterality Date  . JOINT REPLACEMENT    . KNEE SURGERY Right   . SHOULDER SURGERY Left     There were no vitals filed for this visit.  Subjective Assessment - 02/03/18 0905    Subjective  Patient reports she took a pain pill today because her pain is about 4/10.    Pertinent History  right TKA, and previous shoulder surgery    Limitations  Sitting;Standing;Walking;House hold activities    Patient Stated Goals  walk without walker    Currently in Pain?  Yes    Pain Score  4     Pain Location  Knee    Pain Orientation  Left    Pain Descriptors / Indicators  Sore;Tightness    Pain Type  Surgical pain    Pain Onset  More than a month ago    Pain Frequency  Intermittent         [facility] PT Assessment - 02/03/18 0001      Assessment   Medical Diagnosis  Left total knee replacement    Onset Date/Surgical Date  12/27/17    Next MD Visit  02/10/2018    Prior Therapy  yes, home health                   [facility] Adult PT Treatment/Exercise - 02/03/18 0001      Ambulation/Gait   Ambulation Distance (Feet)  260 Feet     Gait Pattern  Step-through pattern;Decreased step length - right;Decreased stride length;Decreased hip/knee flexion - left;Decreased weight shift to left      Exercises   Exercises  Knee/Hip      Knee/Hip Exercises: Stretches   Passive Hamstring Stretch  Left;3 reps;30 seconds    Knee: Self-Stretch to increase Flexion  Left;3 reps;30 seconds    Knee: Self-Stretch Limitations  on step      Knee/Hip Exercises: Aerobic   Stationary Bike  x16 minutes      Modalities   Modalities  Psychologist, educational Location  L knee    Electrical Stimulation Action  IFC    Electrical Stimulation Parameters  80-150 hz x15 min    Electrical Stimulation Goals  Edema;Pain      Vasopneumatic   Number Minutes Vasopneumatic   15 minutes    Vasopnuematic Location   Knee    Vasopneumatic Pressure  Low    Vasopneumatic Temperature   36  Decreased activity tolerance, Decreased endurance, Decreased range of motion, Decreased strength, Decreased balance, Difficulty walking, Increased edema  Visit Diagnosis: Acute  pain of left knee  Stiffness of left knee, not elsewhere classified  Difficulty in walking, not elsewhere classified     Problem List Patient Active Problem List   Diagnosis Date Noted  . Tobacco abuse 11/04/2017  . Primary osteoarthritis of left knee 10/29/2017  . Allergic rhinitis 03/28/2005  . Sensorineural hearing loss 03/28/2005    Gabriela Eves, PT, DPT 02/03/2018, 10:55 AM  [facility] [address] [place], [place], [address] Phone: [phone]   Fax:  [phone]  Name: Cheryl Gonzalez MRN: [id number] Date of Birth: Jun 18, 1951  Decreased activity tolerance, Decreased endurance, Decreased range of motion, Decreased strength, Decreased balance, Difficulty walking, Increased edema  Visit Diagnosis: Acute  pain of left knee  Stiffness of left knee, not elsewhere classified  Difficulty in walking, not elsewhere classified     Problem List Patient Active Problem List   Diagnosis Date Noted  . Tobacco abuse 11/04/2017  . Primary osteoarthritis of left knee 10/29/2017  . Allergic rhinitis 03/28/2005  . Sensorineural hearing loss 03/28/2005    Gabriela Eves, PT, DPT 02/03/2018, 10:55 AM  [facility] [address] [place], [place], [address] Phone: [phone]   Fax:  [phone]  Name: Cheryl Gonzalez MRN: [id number] Date of Birth: Jun 18, 1951

## 2018-02-05 ENCOUNTER — Ambulatory Visit: Payer: Medicare Other | Admitting: Physical Therapy

## 2018-02-05 DIAGNOSIS — R262 Difficulty in walking, not elsewhere classified: Secondary | ICD-10-CM | POA: Diagnosis not present

## 2018-02-05 DIAGNOSIS — M25662 Stiffness of left knee, not elsewhere classified: Secondary | ICD-10-CM

## 2018-02-05 DIAGNOSIS — M25562 Pain in left knee: Secondary | ICD-10-CM | POA: Diagnosis not present

## 2018-02-05 NOTE — Therapy (Signed)
[facility] [place], [place], [address] Phone: [phone]   Fax:  [phone]  Physical Therapy Treatment  Patient Details  Name: Cheryl Gonzalez MRN: [id number] Date of Birth: Feb 15, 1951 Referring Provider: Cheral Almas   Encounter Date: 02/05/2018  PT End of Session - 02/05/18 0919    Visit Number  10    Number of Visits  12    Date for PT Re-Evaluation  02/19/18    Authorization Type  FOTO every 5th visit, progress note every 10th visit, KX modifier at 15th visit    PT Start Time  0907    PT Stop Time  0955    PT Time Calculation (min)  48 min    Activity Tolerance  Patient tolerated treatment well    Behavior During Therapy  [facility] for tasks assessed/performed       Past Medical History:  Diagnosis Date  . Cigarette smoker   . History of meniscal tear     Past Surgical History:  Procedure Laterality Date  . JOINT REPLACEMENT    . KNEE SURGERY Right   . SHOULDER SURGERY Left     There were no vitals filed for this visit.      [facility] PT Assessment - 02/05/18 0001      Assessment   Medical Diagnosis  Left total knee replacement    Onset Date/Surgical Date  12/27/17    Next MD Visit  02/10/2018    Prior Therapy  yes, home health      Observation/Other Assessments   Focus on Therapeutic Outcomes (FOTO)   41% limited                   [facility] Adult PT Treatment/Exercise - 02/05/18 0001      Ambulation/Gait   Ambulation Distance (Feet)  412 Feet    Gait Pattern  Step-through pattern;Decreased step length - right;Decreased stride length;Decreased hip/knee flexion - left;Decreased weight shift to left    Ambulation Surface  Level;Outdoor;Paved      Exercises   Exercises  Knee/Hip      Knee/Hip Exercises: Aerobic   Stationary Bike  x16 minutes      Modalities   Modalities  Psychologist, educational Location  L knee    Electrical Stimulation Action  IFC    Electrical Stimulation Parameters  80-150 hz x15 min    Electrical Stimulation Goals  Edema;Pain      Vasopneumatic   Number Minutes Vasopneumatic   15 minutes    Vasopnuematic Location   Knee    Vasopneumatic Pressure  Low    Vasopneumatic Temperature   36      Manual Therapy   Manual Therapy  Passive ROM    Passive ROM  PROM of L knee into flexion, extension with gentle holds at end range               PT Short Term Goals - 01/27/18 1620      PT SHORT TERM GOAL #1   Title  Patient will improve left knee extension to 0 degrees to normalize gait pattern.    Time  2    Period  Weeks    Status  On-going      PT SHORT TERM GOAL #2   Title  Patient will ambulate 200 feet with straight cane or least restricted AD around clinic with minimal gait deviations    Time  2  Period  Weeks    Status  On-going        PT Long Term Goals - 02/05/18 1203      PT LONG TERM GOAL #1   Title  Patient will be independent with advanced HEP    Time  4    Period  Weeks    Status  On-going      PT LONG TERM GOAL #2   Title  Patient will improve left knee flexion to 115+ degrees to improve ability to perform functional activities.    Time  4    Period  Weeks    Status  On-going      PT LONG TERM GOAL #3   Title  Patient will demonstrate 4+/5- 5/5 strength in L LE to improve stability during functional tasks.    Time  4    Period  Weeks    Status  On-going      PT LONG TERM GOAL #4   Title  Patient will report ability to negotiate 16 steps with right railing with reciprocating pattern to safely access her bedroom.    Time  4    Period  Weeks    Status  On-going      PT LONG TERM GOAL #5   Title  Patient will ambulate community distances with less than 3/10 pain in L knee and no AD.    Time  4    Period  Weeks    Status  On-going            Plan - 02/05/18 1159    Clinical Impression Statement  Patient was able to tolerate  treatment well. Patient noted with minimal L hip hiking while ascending steps. Patient was able to ambulate outdoors without an AD and with decreased R step length and decreased L stance time. Patient improved mechanics after cuing. Patient's FOTO score of 49% indicates improved ability to perform functional task. Patient's left knee AROM 1-95 degrees; PROM 0-105 degrees. Normal response to modalities at end of session.     Clinical Presentation  Stable    Clinical Decision Making  Low    Rehab Potential  Good    PT Frequency  3x / week    PT Duration  4 weeks    PT Treatment/Interventions  ADLs/Self Care Home Management;Cryotherapy;Electrical Stimulation;Moist Heat;Gait training;Stair training;Functional mobility training;Therapeutic activities;Therapeutic exercise;Balance training;Patient/family education;Neuromuscular re-education;Manual techniques;Vasopneumatic Device;Taping;Passive range of motion    PT Next Visit Plan  cont gait training without a cane, knee flexion ROM and quad strengthening modalites PRN for pain relief    Consulted and Agree with Plan of Care  Patient       Patient will benefit from skilled therapeutic intervention in order to improve the following deficits and impairments:  Pain, Decreased activity tolerance, Decreased endurance, Decreased range of motion, Decreased strength, Decreased balance, Difficulty walking, Increased edema  Visit Diagnosis: Acute pain of left knee  Stiffness of left knee, not elsewhere classified  Difficulty in walking, not elsewhere classified     Problem List Patient Active Problem List   Diagnosis Date Noted  . Tobacco abuse 11/04/2017  . Primary osteoarthritis of left knee 10/29/2017  . Allergic rhinitis 03/28/2005  . Sensorineural hearing loss 03/28/2005     Gabriela Eves, PT, DPT 02/05/2018, 12:09 PM  [facility] [address] [place], [place], [address] Phone: [phone]    Fax:  [phone]  Name: Cheryl Gonzalez MRN: [id number] Date of Birth: [birth year]

## 2018-02-07 ENCOUNTER — Ambulatory Visit: Payer: Medicare Other | Admitting: Physical Therapy

## 2018-02-07 ENCOUNTER — Encounter: Payer: Self-pay | Admitting: Physical Therapy

## 2018-02-07 DIAGNOSIS — R262 Difficulty in walking, not elsewhere classified: Secondary | ICD-10-CM

## 2018-02-07 DIAGNOSIS — M25562 Pain in left knee: Secondary | ICD-10-CM | POA: Diagnosis not present

## 2018-02-07 DIAGNOSIS — M25662 Stiffness of left knee, not elsewhere classified: Secondary | ICD-10-CM | POA: Diagnosis not present
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
## 2018-02-10 DIAGNOSIS — Z471 Aftercare following joint replacement surgery: Secondary | ICD-10-CM | POA: Diagnosis not present

## 2018-02-10 DIAGNOSIS — Z96652 Presence of left artificial knee joint: Secondary | ICD-10-CM | POA: Diagnosis not present

## 2018-02-11 ENCOUNTER — Encounter: Payer: Medicare Other | Admitting: Physical Therapy

## 2018-02-12 ENCOUNTER — Ambulatory Visit: Payer: Medicare Other | Admitting: Physical Therapy

## 2018-02-12 ENCOUNTER — Encounter: Payer: Self-pay | Admitting: Physical Therapy

## 2018-02-12 DIAGNOSIS — M25562 Pain in left knee: Secondary | ICD-10-CM | POA: Diagnosis not present

## 2018-02-12 DIAGNOSIS — M25662 Stiffness of left knee, not elsewhere classified: Secondary | ICD-10-CM | POA: Diagnosis not present

## 2018-02-12 DIAGNOSIS — R262 Difficulty in walking, not elsewhere classified: Secondary | ICD-10-CM | POA: Diagnosis not present

## 2018-02-12 NOTE — Therapy (Addendum)
[facility] [place], [place], [address] Phone: [phone]   Fax:  [phone]  Physical Therapy Treatment  Patient Details  Name: Cheryl Gonzalez MRN: [id number] Date of Birth: 1951-04-06 Referring Provider: Cheral Almas   Encounter Date: 02/12/2018  PT End of Session - 02/12/18 0912    Visit Number  12    Number of Visits  24 NO order signed by MD 02/10/2018    Date for PT Re-Evaluation  03/24/18    Authorization Type  FOTO every 5th visit, progress note every 10th visit, KX modifier at 15th visit    PT Start Time  0903    PT Stop Time  1003    PT Time Calculation (min)  60 min    Activity Tolerance  Patient tolerated treatment well    Behavior During Therapy  [facility] for tasks assessed/performed       Past Medical History:  Diagnosis Date  . Cigarette smoker   . History of meniscal tear     Past Surgical History:  Procedure Laterality Date  . JOINT REPLACEMENT    . KNEE SURGERY Right   . SHOULDER SURGERY Left     There were no vitals filed for this visit.  Subjective Assessment - 02/12/18 0909    Subjective  Reports that she has had some pain since Monday as she went to the MD and he would not refill her pain medication. Was prescribed Meloxicam to assist with pain and that has helped since starting yesterday.    Pertinent History  right TKA, and previous shoulder surgery    Limitations  Sitting;Standing;Walking;House hold activities    Patient Stated Goals  walk without walker    Currently in Pain?  No/denies         [facility] PT Assessment - 02/12/18 0001      Assessment   Medical Diagnosis  Left total knee replacement    Onset Date/Surgical Date  12/27/17    Next MD Visit  2020    Prior Therapy  yes, home health                   [facility] Adult PT Treatment/Exercise - 02/12/18 0001      Knee/Hip Exercises: Aerobic   Stationary Bike  L3, seat 5 x 16 min      Knee/Hip Exercises: Machines for  Strengthening   Cybex Knee Extension  10# 3x10 reps    Cybex Knee Flexion  30# 3x10 reps    Cybex Leg Press  2 pl, seat 6 x 30 reps      Knee/Hip Exercises: Standing   Terminal Knee Extension  Strengthening;Left;3 sets;10 reps;Theraband    Theraband Level (Terminal Knee Extension)  Level 3 (Green)      Knee/Hip Exercises: Supine   Bridges  Strengthening;2 sets;10 reps    Straight Leg Raises  AROM;Left;2 sets;10 reps      Modalities   Modalities  Psychologist, educational Location  L knee    Electrical Stimulation Action  IFC    Electrical Stimulation Parameters  80-150 hz x15 min    Electrical Stimulation Goals  Edema;Pain      Vasopneumatic   Number Minutes Vasopneumatic   15 minutes    Vasopnuematic Location   Knee    Vasopneumatic Pressure  Medium    Vasopneumatic Temperature   34  Problem  List Patient Active Problem List   Diagnosis Date Noted  . Tobacco abuse 11/04/2017  . Primary osteoarthritis of left knee 10/29/2017  . Allergic rhinitis 03/28/2005  . Sensorineural hearing loss 03/28/2005    [name], PTA 02/12/2018, 10:20 AM  [facility] [address] [place], [place], [address] Phone: [phone]   Fax:  [phone]  Name: [name] MRN: [id number] Date of Birth: 1951/02/24  Problem  List Patient Active Problem List   Diagnosis Date Noted  . Tobacco abuse 11/04/2017  . Primary osteoarthritis of left knee 10/29/2017  . Allergic rhinitis 03/28/2005  . Sensorineural hearing loss 03/28/2005    [name], PTA 02/12/2018, 10:20 AM  [facility] [address] [place], [place], [address] Phone: [phone]   Fax:  [phone]  Name: [name] MRN: [id number] Date of Birth: 1951/02/24

## 2018-02-14 ENCOUNTER — Encounter: Payer: Self-pay | Admitting: Physical Therapy

## 2018-02-14 ENCOUNTER — Ambulatory Visit: Payer: Medicare Other | Admitting: Physical Therapy

## 2018-02-14 DIAGNOSIS — M25562 Pain in left knee: Secondary | ICD-10-CM

## 2018-02-14 DIAGNOSIS — R262 Difficulty in walking, not elsewhere classified: Secondary | ICD-10-CM

## 2018-02-14 DIAGNOSIS — M25662 Stiffness of left knee, not elsewhere classified: Secondary | ICD-10-CM

## 2018-02-14 NOTE — Therapy (Signed)
[facility] [place], [place], [address] Phone: [phone]   Fax:  [phone]  Physical Therapy Treatment  Patient Details  Name: Cheryl Gonzalez MRN: [id number] Date of Birth: 1951/02/23 Referring Provider: Cheral Almas   Encounter Date: 02/14/2018  PT End of Session - 02/14/18 0905    Visit Number  13    Number of Visits  24    Date for PT Re-Evaluation  03/24/18    Authorization Type  FOTO every 5th visit, progress note every 10th visit, KX modifier at 15th visit    PT Start Time  0903    PT Stop Time  1003    PT Time Calculation (min)  60 min    Activity Tolerance  Patient tolerated treatment well    Behavior During Therapy  [name] for tasks assessed/performed       Past Medical History:  Diagnosis Date  . Cigarette smoker   . History of meniscal tear     Past Surgical History:  Procedure Laterality Date  . JOINT REPLACEMENT    . KNEE SURGERY Right   . SHOULDER SURGERY Left     There were no vitals filed for this visit.  Subjective Assessment - 02/14/18 0904    Subjective  Denies any issues or pain and reports wishing she had left her cane in the car as she doesn't really use it that much.    Pertinent History  right TKA, and previous shoulder surgery    Limitations  Sitting;Standing;Walking;House hold activities    Patient Stated Goals  walk without walker    Currently in Pain?  No/denies         [name] PT Assessment - 02/14/18 0001      Assessment   Medical Diagnosis  Left total knee replacement    Onset Date/Surgical Date  12/27/17    Next MD Visit  2020    Prior Therapy  yes, home health                   [name] Adult PT Treatment/Exercise - 02/14/18 0001      Knee/Hip Exercises: Aerobic   Stationary Bike  L3, seat 4 x 15 min      Knee/Hip Exercises: Machines for Strengthening   Cybex Knee Extension  10# 3x10 reps    Cybex Knee Flexion  40# 3x10 reps    Cybex Leg Press  2 pl, seat  6 x30 reps      Knee/Hip Exercises: Standing   Wall Squat  15 reps      Knee/Hip Exercises: Supine   Straight Leg Raises  AROM;Left;2 sets;10 reps      Knee/Hip Exercises: Sidelying   Hip ABduction  AROM;Left;2 sets;10 reps      Modalities   Modalities  Psychologist, educational Location  L knee    Electrical Stimulation Action  IFC    Electrical Stimulation Parameters  80-150 hz x15 min    Electrical Stimulation Goals  Edema;Pain      Vasopneumatic   Number Minutes Vasopneumatic   15 minutes    Vasopnuematic Location   Knee    Vasopneumatic Pressure  Medium    Vasopneumatic Temperature   34               PT Short Term Goals - 02/14/18 6301      PT SHORT TERM GOAL #1   Title  Patient will improve left  [facility] [place], [place], [address] Phone: [phone]   Fax:  [phone]  Physical Therapy Treatment  Patient Details  Name: Cheryl Gonzalez MRN: [id number] Date of Birth: 1951/02/23 Referring Provider: Cheral Almas   Encounter Date: 02/14/2018  PT End of Session - 02/14/18 0905    Visit Number  13    Number of Visits  24    Date for PT Re-Evaluation  03/24/18    Authorization Type  FOTO every 5th visit, progress note every 10th visit, KX modifier at 15th visit    PT Start Time  0903    PT Stop Time  1003    PT Time Calculation (min)  60 min    Activity Tolerance  Patient tolerated treatment well    Behavior During Therapy  [name] for tasks assessed/performed       Past Medical History:  Diagnosis Date  . Cigarette smoker   . History of meniscal tear     Past Surgical History:  Procedure Laterality Date  . JOINT REPLACEMENT    . KNEE SURGERY Right   . SHOULDER SURGERY Left     There were no vitals filed for this visit.  Subjective Assessment - 02/14/18 0904    Subjective  Denies any issues or pain and reports wishing she had left her cane in the car as she doesn't really use it that much.    Pertinent History  right TKA, and previous shoulder surgery    Limitations  Sitting;Standing;Walking;House hold activities    Patient Stated Goals  walk without walker    Currently in Pain?  No/denies         [name] PT Assessment - 02/14/18 0001      Assessment   Medical Diagnosis  Left total knee replacement    Onset Date/Surgical Date  12/27/17    Next MD Visit  2020    Prior Therapy  yes, home health                   [name] Adult PT Treatment/Exercise - 02/14/18 0001      Knee/Hip Exercises: Aerobic   Stationary Bike  L3, seat 4 x 15 min      Knee/Hip Exercises: Machines for Strengthening   Cybex Knee Extension  10# 3x10 reps    Cybex Knee Flexion  40# 3x10 reps    Cybex Leg Press  2 pl, seat  6 x30 reps      Knee/Hip Exercises: Standing   Wall Squat  15 reps      Knee/Hip Exercises: Supine   Straight Leg Raises  AROM;Left;2 sets;10 reps      Knee/Hip Exercises: Sidelying   Hip ABduction  AROM;Left;2 sets;10 reps      Modalities   Modalities  Psychologist, educational Location  L knee    Electrical Stimulation Action  IFC    Electrical Stimulation Parameters  80-150 hz x15 min    Electrical Stimulation Goals  Edema;Pain      Vasopneumatic   Number Minutes Vasopneumatic   15 minutes    Vasopnuematic Location   Knee    Vasopneumatic Pressure  Medium    Vasopneumatic Temperature   34               PT Short Term Goals - 02/14/18 6301      PT SHORT TERM GOAL #1   Title  Patient will improve left  [name], PTA 02/14/2018, 11:35 AM  [name] [address] [place], [place], [address] Phone: [phone]   Fax:  [phone]  Name: Cheryl Gonzalez MRN: [id number] Date of Birth: Mar 05, 1951

## 2018-02-17 ENCOUNTER — Encounter: Payer: Self-pay | Admitting: Physical Therapy

## 2018-02-17 ENCOUNTER — Ambulatory Visit: Payer: Medicare Other | Admitting: Physical Therapy

## 2018-02-17 DIAGNOSIS — M25562 Pain in left knee: Secondary | ICD-10-CM | POA: Diagnosis not present

## 2018-02-17 DIAGNOSIS — R262 Difficulty in walking, not elsewhere classified: Secondary | ICD-10-CM

## 2018-02-17 DIAGNOSIS — M25662 Stiffness of left knee, not elsewhere classified: Secondary | ICD-10-CM

## 2018-02-17 NOTE — Therapy (Signed)
[facility] [place], [place], [address] Phone: [phone]   Fax:  [phone]  Physical Therapy Treatment  Patient Details  Name: Cheryl Gonzalez MRN: [id number] Date of Birth: Apr 22, 1951 Referring Provider: Cheral Almas   Encounter Date: 02/17/2018  PT End of Session - 02/17/18 0906    Visit Number  14    Number of Visits  24    Date for PT Re-Evaluation  03/24/18    Authorization Type  FOTO every 5th visit, progress note every 10th visit, KX modifier at 15th visit    PT Start Time  0904    PT Stop Time  1000    PT Time Calculation (min)  56 min    Activity Tolerance  Patient tolerated treatment well    Behavior During Therapy  [facility] for tasks assessed/performed       Past Medical History:  Diagnosis Date  . Cigarette smoker   . History of meniscal tear     Past Surgical History:  Procedure Laterality Date  . JOINT REPLACEMENT    . KNEE SURGERY Right   . SHOULDER SURGERY Left     There were no vitals filed for this visit.  Subjective Assessment - 02/17/18 0906    Subjective  Reports driving this morning to PT. Denies any pain.    Pertinent History  right TKA, and previous shoulder surgery    Limitations  Sitting;Standing;Walking;House hold activities    Patient Stated Goals  walk without walker    Currently in Pain?  No/denies         [facility] PT Assessment - 02/17/18 0001      Assessment   Medical Diagnosis  Left total knee replacement    Onset Date/Surgical Date  12/27/17    Next MD Visit  2020    Prior Therapy  yes, home health                   [facility] Adult PT Treatment/Exercise - 02/17/18 0001      Knee/Hip Exercises: Aerobic   Stationary Bike  L3, seat 4 x15 min      Knee/Hip Exercises: Machines for Strengthening   Cybex Knee Extension  10# 3x10 reps    Cybex Knee Flexion  40# 3x10 reps    Cybex Leg Press  2 pl, seat 6 x30 reps      Knee/Hip Exercises: Standing   Terminal Knee  Extension  Strengthening;Left;3 sets;10 reps;Theraband    Theraband Level (Terminal Knee Extension)  Other (comment) Pin kXTS      Knee/Hip Exercises: Supine   Straight Leg Raises  AROM;Left;3 sets;10 reps      Modalities   Modalities  Psychologist, educational Location  L knee    Electrical Stimulation Action  IFC    Electrical Stimulation Parameters  80-150 hz x15 min    Electrical Stimulation Goals  Edema;Pain      Vasopneumatic   Number Minutes Vasopneumatic   15 minutes    Vasopnuematic Location   Knee    Vasopneumatic Pressure  Medium    Vasopneumatic Temperature   66               PT Short Term Goals - 02/14/18 3762      PT SHORT TERM GOAL #1   Title  Patient will improve left knee extension to 0 degrees to normalize gait pattern.    Time  2  [facility] [place], [place], [address] Phone: [phone]   Fax:  [phone]  Physical Therapy Treatment  Patient Details  Name: Cheryl Gonzalez MRN: [id number] Date of Birth: Apr 22, 1951 Referring Provider: Cheral Almas   Encounter Date: 02/17/2018  PT End of Session - 02/17/18 0906    Visit Number  14    Number of Visits  24    Date for PT Re-Evaluation  03/24/18    Authorization Type  FOTO every 5th visit, progress note every 10th visit, KX modifier at 15th visit    PT Start Time  0904    PT Stop Time  1000    PT Time Calculation (min)  56 min    Activity Tolerance  Patient tolerated treatment well    Behavior During Therapy  [facility] for tasks assessed/performed       Past Medical History:  Diagnosis Date  . Cigarette smoker   . History of meniscal tear     Past Surgical History:  Procedure Laterality Date  . JOINT REPLACEMENT    . KNEE SURGERY Right   . SHOULDER SURGERY Left     There were no vitals filed for this visit.  Subjective Assessment - 02/17/18 0906    Subjective  Reports driving this morning to PT. Denies any pain.    Pertinent History  right TKA, and previous shoulder surgery    Limitations  Sitting;Standing;Walking;House hold activities    Patient Stated Goals  walk without walker    Currently in Pain?  No/denies         [facility] PT Assessment - 02/17/18 0001      Assessment   Medical Diagnosis  Left total knee replacement    Onset Date/Surgical Date  12/27/17    Next MD Visit  2020    Prior Therapy  yes, home health                   [facility] Adult PT Treatment/Exercise - 02/17/18 0001      Knee/Hip Exercises: Aerobic   Stationary Bike  L3, seat 4 x15 min      Knee/Hip Exercises: Machines for Strengthening   Cybex Knee Extension  10# 3x10 reps    Cybex Knee Flexion  40# 3x10 reps    Cybex Leg Press  2 pl, seat 6 x30 reps      Knee/Hip Exercises: Standing   Terminal Knee  Extension  Strengthening;Left;3 sets;10 reps;Theraband    Theraband Level (Terminal Knee Extension)  Other (comment) Pin kXTS      Knee/Hip Exercises: Supine   Straight Leg Raises  AROM;Left;3 sets;10 reps      Modalities   Modalities  Psychologist, educational Location  L knee    Electrical Stimulation Action  IFC    Electrical Stimulation Parameters  80-150 hz x15 min    Electrical Stimulation Goals  Edema;Pain      Vasopneumatic   Number Minutes Vasopneumatic   15 minutes    Vasopnuematic Location   Knee    Vasopneumatic Pressure  Medium    Vasopneumatic Temperature   66               PT Short Term Goals - 02/14/18 3762      PT SHORT TERM GOAL #1   Title  Patient will improve left knee extension to 0 degrees to normalize gait pattern.    Time  2  [name], PTA 02/17/2018, 10:25 AM  [facility] [address] [place], [place], [address] Phone: [phone]   Fax:  [phone]  Name: Cheryl Gonzalez MRN: [id number] Date of Birth: 1951-03-25

## 2018-02-19 ENCOUNTER — Ambulatory Visit: Payer: Medicare Other | Admitting: Physical Therapy

## 2018-02-19 ENCOUNTER — Encounter: Payer: Self-pay | Admitting: Physical Therapy

## 2018-02-19 DIAGNOSIS — R262 Difficulty in walking, not elsewhere classified: Secondary | ICD-10-CM

## 2018-02-19 DIAGNOSIS — M25562 Pain in left knee: Secondary | ICD-10-CM | POA: Diagnosis not present

## 2018-02-19 DIAGNOSIS — M25662 Stiffness of left knee, not elsewhere classified: Secondary | ICD-10-CM | POA: Diagnosis not present

## 2018-02-19 NOTE — Therapy (Signed)
[facility] [place], [place], [address] Phone: [phone]   Fax:  [phone]  Physical Therapy Treatment  Patient Details  Name: Cheryl Gonzalez MRN: [id number] Date of Birth: August 28, 1950 Referring Provider: Cheral Almas   Encounter Date: 02/19/2018  PT End of Session - 02/19/18 0928    Visit Number  15    Number of Visits  24    Date for PT Re-Evaluation  03/24/18    Authorization Type  FOTO every 5th visit, progress note every 10th visit, KX modifier at 15th visit    PT Start Time  0904    PT Stop Time  0958    PT Time Calculation (min)  54 min    Activity Tolerance  Patient tolerated treatment well    Behavior During Therapy  [facility] for tasks assessed/performed       Past Medical History:  Diagnosis Date  . Cigarette smoker   . History of meniscal tear     Past Surgical History:  Procedure Laterality Date  . JOINT REPLACEMENT    . KNEE SURGERY Right   . SHOULDER SURGERY Left     There were no vitals filed for this visit.  Subjective Assessment - 02/19/18 0916    Subjective  Denies any pain.    Pertinent History  right TKA, and previous shoulder surgery    Limitations  Sitting;Standing;Walking;House hold activities    Patient Stated Goals  walk without walker    Currently in Pain?  No/denies         [facility] PT Assessment - 02/19/18 0001      Assessment   Medical Diagnosis  Left total knee replacement    Onset Date/Surgical Date  12/27/17    Next MD Visit  2020    Prior Therapy  yes, home health                   [facility] Adult PT Treatment/Exercise - 02/19/18 0001      Ambulation/Gait   Stairs  Yes    Stairs Assistance  6: Modified independent (Device/Increase time)    Stairs Assistance Details (indicate cue type and reason)  Greater difficulty descending    Stair Management Technique  One rail Right;Alternating pattern;Step to pattern;Forwards    Number of Stairs  4 x2 RT    Height of  Stairs  6.5      Knee/Hip Exercises: Aerobic   Stationary Bike  L3, seat 4 x12 min      Knee/Hip Exercises: Machines for Strengthening   Cybex Knee Extension  10# 3x10 reps    Cybex Knee Flexion  30# 3x10 reps    Cybex Leg Press  2 pl, seat 6 x30 reps      Knee/Hip Exercises: Standing   Terminal Knee Extension  Strengthening;Left;3 sets;10 reps;Theraband    Theraband Level (Terminal Knee Extension)  Other (comment) Pink XTS      Modalities   Modalities  Electrical Stimulation;Vasopneumatic      Electrical Stimulation   Electrical Stimulation Location  L knee    Electrical Stimulation Action  IFC    Electrical Stimulation Parameters  80-150 hz x15 min    Electrical Stimulation Goals  Edema      Vasopneumatic   Number Minutes Vasopneumatic   15 minutes    Vasopnuematic Location   Knee    Vasopneumatic Pressure  Medium    Vasopneumatic Temperature   34  [facility] [place], [place], [address] Phone: [phone]   Fax:  [phone]  Physical Therapy Treatment  Patient Details  Name: Cheryl Gonzalez MRN: [id number] Date of Birth: August 28, 1950 Referring Provider: Cheral Almas   Encounter Date: 02/19/2018  PT End of Session - 02/19/18 0928    Visit Number  15    Number of Visits  24    Date for PT Re-Evaluation  03/24/18    Authorization Type  FOTO every 5th visit, progress note every 10th visit, KX modifier at 15th visit    PT Start Time  0904    PT Stop Time  0958    PT Time Calculation (min)  54 min    Activity Tolerance  Patient tolerated treatment well    Behavior During Therapy  [facility] for tasks assessed/performed       Past Medical History:  Diagnosis Date  . Cigarette smoker   . History of meniscal tear     Past Surgical History:  Procedure Laterality Date  . JOINT REPLACEMENT    . KNEE SURGERY Right   . SHOULDER SURGERY Left     There were no vitals filed for this visit.  Subjective Assessment - 02/19/18 0916    Subjective  Denies any pain.    Pertinent History  right TKA, and previous shoulder surgery    Limitations  Sitting;Standing;Walking;House hold activities    Patient Stated Goals  walk without walker    Currently in Pain?  No/denies         [facility] PT Assessment - 02/19/18 0001      Assessment   Medical Diagnosis  Left total knee replacement    Onset Date/Surgical Date  12/27/17    Next MD Visit  2020    Prior Therapy  yes, home health                   [facility] Adult PT Treatment/Exercise - 02/19/18 0001      Ambulation/Gait   Stairs  Yes    Stairs Assistance  6: Modified independent (Device/Increase time)    Stairs Assistance Details (indicate cue type and reason)  Greater difficulty descending    Stair Management Technique  One rail Right;Alternating pattern;Step to pattern;Forwards    Number of Stairs  4 x2 RT    Height of  Stairs  6.5      Knee/Hip Exercises: Aerobic   Stationary Bike  L3, seat 4 x12 min      Knee/Hip Exercises: Machines for Strengthening   Cybex Knee Extension  10# 3x10 reps    Cybex Knee Flexion  30# 3x10 reps    Cybex Leg Press  2 pl, seat 6 x30 reps      Knee/Hip Exercises: Standing   Terminal Knee Extension  Strengthening;Left;3 sets;10 reps;Theraband    Theraband Level (Terminal Knee Extension)  Other (comment) Pink XTS      Modalities   Modalities  Electrical Stimulation;Vasopneumatic      Electrical Stimulation   Electrical Stimulation Location  L knee    Electrical Stimulation Action  IFC    Electrical Stimulation Parameters  80-150 hz x15 min    Electrical Stimulation Goals  Edema      Vasopneumatic   Number Minutes Vasopneumatic   15 minutes    Vasopnuematic Location   Knee    Vasopneumatic Pressure  Medium    Vasopneumatic Temperature   34  classified     Problem List Patient Active Problem List   Diagnosis Date Noted  . Tobacco abuse 11/04/2017  . Primary osteoarthritis of left knee 10/29/2017  . Allergic rhinitis 03/28/2005  . Sensorineural hearing loss 03/28/2005    [name], PTA 02/19/2018, 10:39 AM  [facility] [address] [place], [place], [address] Phone: [phone]   Fax:  [phone]  Name: Cheryl Gonzalez MRN: [id number] Date of Birth: 11/05/1950

## 2018-02-21 ENCOUNTER — Encounter: Payer: Self-pay | Admitting: Physical Therapy

## 2018-02-21 ENCOUNTER — Ambulatory Visit: Payer: Medicare Other | Admitting: Physical Therapy

## 2018-02-21 DIAGNOSIS — M25662 Stiffness of left knee, not elsewhere classified: Secondary | ICD-10-CM

## 2018-02-21 DIAGNOSIS — R262 Difficulty in walking, not elsewhere classified: Secondary | ICD-10-CM | POA: Diagnosis not present

## 2018-02-21 DIAGNOSIS — M25562 Pain in left knee: Secondary | ICD-10-CM | POA: Diagnosis not present

## 2018-02-21 NOTE — Therapy (Signed)
[facility] [place], [place], [address] Phone: [phone]   Fax:  [phone]  Physical Therapy Treatment  Patient Details  Name: Cheryl Gonzalez MRN: [id number] Date of Birth: 1951-02-19 Referring Provider: Cheral Almas   Encounter Date: 02/21/2018  PT End of Session - 02/21/18 0924    Visit Number  16    Number of Visits  24    Date for PT Re-Evaluation  03/24/18    Authorization Type  FOTO every 5th visit, progress note every 10th visit, KX modifier at 15th visit    PT Start Time  0903    PT Stop Time  0957    PT Time Calculation (min)  54 min    Activity Tolerance  Patient tolerated treatment well    Behavior During Therapy  [facility] for tasks assessed/performed       Past Medical History:  Diagnosis Date  . Cigarette smoker   . History of meniscal tear     Past Surgical History:  Procedure Laterality Date  . JOINT REPLACEMENT    . KNEE SURGERY Right   . SHOULDER SURGERY Left     There were no vitals filed for this visit.  Subjective Assessment - 02/21/18 0920    Subjective  Reports that she slept on her side last night and had some discomfort in her knee.    Pertinent History  right TKA, and previous shoulder surgery    Limitations  Sitting;Standing;Walking;House hold activities    Patient Stated Goals  walk without walker    Currently in Pain?  No/denies         [facility] PT Assessment - 02/21/18 0001      Assessment   Medical Diagnosis  Left total knee replacement    Onset Date/Surgical Date  12/27/17    Next MD Visit  2020    Prior Therapy  yes, home health                   [facility] Adult PT Treatment/Exercise - 02/21/18 0001      Knee/Hip Exercises: Aerobic   Stationary Bike  L3 x15 min      Knee/Hip Exercises: Machines for Strengthening   Cybex Knee Extension  20# 3x10 reps    Cybex Knee Flexion  40# 3x10 reps    Cybex Leg Press  2 pl, seat 4 x30 reps      Knee/Hip Exercises:  Standing   Step Down  Left;2 sets;10 reps;Hand Hold: 2;Step Height: 4";Step Height: 6" 6" step eccentric step down; 4" heel dot      Modalities   Modalities  Electrical Stimulation;Vasopneumatic      Electrical Stimulation   Electrical Stimulation Location  L knee    Electrical Stimulation Action  IFC    Electrical Stimulation Parameters  80-150 hz x15 min    Electrical Stimulation Goals  Edema      Vasopneumatic   Number Minutes Vasopneumatic   15 minutes    Vasopnuematic Location   Knee    Vasopneumatic Pressure  Medium    Vasopneumatic Temperature   34               PT Short Term Goals - 02/14/18 1941      PT SHORT TERM GOAL #1   Title  Patient will improve left knee extension to 0 degrees to normalize gait pattern.    Time  2    Period  Weeks    Status  On-going  [facility] [place], [place], [address] Phone: [phone]   Fax:  [phone]  Physical Therapy Treatment  Patient Details  Name: Cheryl Gonzalez MRN: [id number] Date of Birth: 1951-02-19 Referring Provider: Cheral Almas   Encounter Date: 02/21/2018  PT End of Session - 02/21/18 0924    Visit Number  16    Number of Visits  24    Date for PT Re-Evaluation  03/24/18    Authorization Type  FOTO every 5th visit, progress note every 10th visit, KX modifier at 15th visit    PT Start Time  0903    PT Stop Time  0957    PT Time Calculation (min)  54 min    Activity Tolerance  Patient tolerated treatment well    Behavior During Therapy  [facility] for tasks assessed/performed       Past Medical History:  Diagnosis Date  . Cigarette smoker   . History of meniscal tear     Past Surgical History:  Procedure Laterality Date  . JOINT REPLACEMENT    . KNEE SURGERY Right   . SHOULDER SURGERY Left     There were no vitals filed for this visit.  Subjective Assessment - 02/21/18 0920    Subjective  Reports that she slept on her side last night and had some discomfort in her knee.    Pertinent History  right TKA, and previous shoulder surgery    Limitations  Sitting;Standing;Walking;House hold activities    Patient Stated Goals  walk without walker    Currently in Pain?  No/denies         [facility] PT Assessment - 02/21/18 0001      Assessment   Medical Diagnosis  Left total knee replacement    Onset Date/Surgical Date  12/27/17    Next MD Visit  2020    Prior Therapy  yes, home health                   [facility] Adult PT Treatment/Exercise - 02/21/18 0001      Knee/Hip Exercises: Aerobic   Stationary Bike  L3 x15 min      Knee/Hip Exercises: Machines for Strengthening   Cybex Knee Extension  20# 3x10 reps    Cybex Knee Flexion  40# 3x10 reps    Cybex Leg Press  2 pl, seat 4 x30 reps      Knee/Hip Exercises:  Standing   Step Down  Left;2 sets;10 reps;Hand Hold: 2;Step Height: 4";Step Height: 6" 6" step eccentric step down; 4" heel dot      Modalities   Modalities  Electrical Stimulation;Vasopneumatic      Electrical Stimulation   Electrical Stimulation Location  L knee    Electrical Stimulation Action  IFC    Electrical Stimulation Parameters  80-150 hz x15 min    Electrical Stimulation Goals  Edema      Vasopneumatic   Number Minutes Vasopneumatic   15 minutes    Vasopnuematic Location   Knee    Vasopneumatic Pressure  Medium    Vasopneumatic Temperature   34               PT Short Term Goals - 02/14/18 1941      PT SHORT TERM GOAL #1   Title  Patient will improve left knee extension to 0 degrees to normalize gait pattern.    Time  2    Period  Weeks    Status  On-going  Birth: 17-Aug-1950

## 2018-02-24 ENCOUNTER — Ambulatory Visit: Payer: Medicare Other | Admitting: Physical Therapy

## 2018-02-24 DIAGNOSIS — R262 Difficulty in walking, not elsewhere classified: Secondary | ICD-10-CM

## 2018-02-24 DIAGNOSIS — M25562 Pain in left knee: Secondary | ICD-10-CM | POA: Diagnosis not present

## 2018-02-24 DIAGNOSIS — M25662 Stiffness of left knee, not elsewhere classified: Secondary | ICD-10-CM

## 2018-02-24 NOTE — Therapy (Signed)
[facility] [address] [place], [place], [address] Phone: [phone]   Fax:  [phone]  Physical Therapy Treatment  Patient Details  Name: Analuz Oh MRN: [id number] Date of Birth: February 17, 1951 Referring Provider: Antonieta Iba   Encounter Date: 02/24/2018  PT End of Session - 02/24/18 0904    Visit Number  17    Number of Visits  24    Date for PT Re-Evaluation  03/24/18    Authorization Type  FOTO every 5th visit, progress note every 10th visit, KX modifier at 15th visit    PT Start Time  0902    PT Stop Time  0959    PT Time Calculation (min)  57 min    Activity Tolerance  Patient tolerated treatment well    Behavior During Therapy  [facility] for tasks assessed/performed       Past Medical History:  Diagnosis Date  . Cigarette smoker   . History of meniscal tear     Past Surgical History:  Procedure Laterality Date  . JOINT REPLACEMENT    . KNEE SURGERY Right   . SHOULDER SURGERY Left     There were no vitals filed for this visit.  Subjective Assessment - 02/24/18 0904    Subjective  Patient reports she is just a little stiff this morning.    Pertinent History  right TKA, and previous shoulder surgery    Limitations  Sitting;Standing;Walking;House hold activities    Patient Stated Goals  walk without walker    Currently in Pain?  No/denies         [facility] PT Assessment - 02/24/18 0001      ROM / Strength   AROM / PROM / Strength  AROM;PROM      AROM   Left Knee Extension  -2    Left Knee Flexion  113      PROM   Left Knee Extension  0    Left Knee Flexion  117                   [facility] Adult PT Treatment/Exercise - 02/24/18 0001      Knee/Hip Exercises: Aerobic   Stationary Bike  L3 x15 min      Knee/Hip Exercises: Machines for Strengthening   Cybex Knee Extension  20# 3x10 reps    Cybex Knee Flexion  40# 3x10 reps    Cybex Leg Press  2 pl, seat 4 x30 reps      Knee/Hip Exercises: Standing    Step Down  Left;2 sets;10 reps;Hand Hold: 2;Step Height: 4";Step Height: 6" heel dot 4" and step downs from 6 "    Functional Squat  2 sets;10 reps on rockerboard decline for ecc quads      Modalities   Modalities  Primary school teacher Stimulation Location  L knee    Electrical Stimulation Action  IFC    Electrical Stimulation Parameters  1-10 Hz x 15 min    Electrical Stimulation Goals  Edema      Vasopneumatic   Number Minutes Vasopneumatic   15 minutes    Vasopnuematic Location   Knee    Vasopneumatic Pressure  Medium    Vasopneumatic Temperature   34      Manual Therapy   Manual therapy comments  demonstrated MFR with cane to L ITB and quad               PT Short  Term Goals - 02/14/18 0921      PT SHORT TERM GOAL #1   Title  Patient will improve left knee extension to 0 degrees to normalize gait pattern.    Time  2    Period  Weeks    Status  On-going      PT SHORT TERM GOAL #2   Title  Patient will ambulate 200 feet with straight cane or least restricted AD around clinic with minimal gait deviations    Time  2    Period  Weeks    Status  Achieved        PT Long Term Goals - 02/24/18 0905      PT LONG TERM GOAL #1   Time  4    Period  Weeks            Plan - 02/24/18 1301    Clinical Impression Statement  Patient did very well with TE today. Still has eccentric weakness in quads.    PT Treatment/Interventions  ADLs/Self Care Home Management;Cryotherapy;Electrical Stimulation;Moist Heat;Gait training;Stair training;Functional mobility training;Therapeutic activities;Therapeutic exercise;Balance training;Patient/family education;Neuromuscular re-education;Manual techniques;Vasopneumatic Device;Taping;Passive range of motion    PT Next Visit Plan  cont eccentric quad strengthening; stair training,  knee flexion ROM and quad strengthening modalites PRN for pain relief       Patient will benefit from  skilled therapeutic intervention in order to improve the following deficits and impairments:  Pain, Decreased activity tolerance, Decreased endurance, Decreased range of motion, Decreased strength, Decreased balance, Difficulty walking, Increased edema  Visit Diagnosis: Stiffness of left knee, not elsewhere classified  Difficulty in walking, not elsewhere classified     Problem List Patient Active Problem List   Diagnosis Date Noted  . Tobacco abuse 11/04/2017  . Primary osteoarthritis of left knee 10/29/2017  . Allergic rhinitis 03/28/2005  . Sensorineural hearing loss 03/28/2005    Oniyah Rohe PT 02/24/2018, 1:03 PM  [facility] [address] [place], [place], [address] Phone: [phone]   Fax:  [phone]  Name: Lucelia Blaize MRN: [id number] Date of Birth: Mar 31, 1951

## 2018-02-26 ENCOUNTER — Ambulatory Visit: Payer: Medicare Other

## 2018-02-28 ENCOUNTER — Ambulatory Visit: Payer: Medicare Other | Attending: General Surgery | Admitting: Physical Therapy

## 2018-02-28 ENCOUNTER — Encounter: Payer: Self-pay | Admitting: Physical Therapy

## 2018-02-28 DIAGNOSIS — R262 Difficulty in walking, not elsewhere classified: Secondary | ICD-10-CM | POA: Insufficient documentation

## 2018-02-28 DIAGNOSIS — M25562 Pain in left knee: Secondary | ICD-10-CM | POA: Diagnosis not present

## 2018-02-28 DIAGNOSIS — M25662 Stiffness of left knee, not elsewhere classified: Secondary | ICD-10-CM | POA: Insufficient documentation

## 2018-02-28 NOTE — Therapy (Signed)
[facility] [address] [place], [place], [address] Phone: [phone]   Fax:  [phone]  Physical Therapy Treatment  Patient Details  Name: Cheryl Gonzalez MRN: [id number] Date of Birth: [birth year] Referring Provider: Antonieta Iba   Encounter Date: 02/28/2018  PT End of Session - 02/28/18 0911    Visit Number  18    Number of Visits  24    Date for PT Re-Evaluation  03/24/18    Authorization Type  FOTO every 5th visit, progress note every 10th visit, KX modifier at 15th visit    PT Start Time  0901    PT Stop Time  0954    PT Time Calculation (min)  53 min    Activity Tolerance  Patient tolerated treatment well    Behavior During Therapy  [facility] for tasks assessed/performed       Past Medical History:  Diagnosis Date  . Cigarette smoker   . History of meniscal tear     Past Surgical History:  Procedure Laterality Date  . JOINT REPLACEMENT    . KNEE SURGERY Right   . SHOULDER SURGERY Left     There were no vitals filed for this visit.  Subjective Assessment - 02/28/18 0909    Subjective  Patient reports improvements with stair ambulation.     Pertinent History  right TKA, and previous shoulder surgery    Limitations  Sitting;Standing;Walking;House hold activities    Patient Stated Goals  walk without walker    Currently in Pain?  No/denies         [facility] PT Assessment - 02/28/18 0001      Assessment   Medical Diagnosis  Left total knee replacement    Onset Date/Surgical Date  12/27/17    Next MD Visit  2020    Prior Therapy  yes, home health                   [facility] Adult PT Treatment/Exercise - 02/28/18 0001      Knee/Hip Exercises: Aerobic   Stationary Bike  L4 x15 min      Knee/Hip Exercises: Machines for Strengthening   Cybex Knee Extension  20# 3x10 reps    Cybex Knee Flexion  40# 3x10 reps    Cybex Leg Press  1 pl, seat 4 x30 reps eccentric control       Knee/Hip Exercises: Standing   Step  Down  Left;2 sets;10 reps;Hand Hold: 2;Step Height: 4" heel dot      Knee/Hip Exercises: Supine   Bridges  Strengthening;2 sets;10 reps      Modalities   Modalities  Geologist, engineering Location  L knee    Electrical Stimulation Action  IFC    Electrical Stimulation Parameters  80-150 hz x15 min    Electrical Stimulation Goals  Edema      Vasopneumatic   Number Minutes Vasopneumatic   15 minutes    Vasopnuematic Location   Knee    Vasopneumatic Pressure  Medium    Vasopneumatic Temperature   34               PT Short Term Goals - 02/14/18 4010      PT SHORT TERM GOAL #1   Title  Patient will improve left knee extension to 0 degrees to normalize gait pattern.    Time  2    Period  Weeks    Status  On-going  PT SHORT TERM GOAL #2   Title  Patient will ambulate 200 feet with straight cane or least restricted AD around clinic with minimal gait deviations    Time  2    Period  Weeks    Status  Achieved        PT Long Term Goals - 02/28/18 1033      PT LONG TERM GOAL #1   Title  Patient will be independent with advanced HEP    Time  4    Period  Weeks    Status  On-going      PT LONG TERM GOAL #2   Title  Patient will improve left knee flexion to 115+ degrees to improve ability to perform functional activities.    Time  4    Period  Weeks    Status  On-going      PT LONG TERM GOAL #3   Title  Patient will demonstrate 4+/5- 5/5 strength in L LE to improve stability during functional tasks.    Time  4    Period  Weeks    Status  On-going      PT LONG TERM GOAL #4   Title  Patient will report ability to negotiate 16 steps with right railing with reciprocating pattern to safely access her bedroom.    Time  4    Period  Weeks    Status  On-going      PT LONG TERM GOAL #5   Title  Patient will ambulate community distances with less than 3/10 pain in L knee and no AD.    Time  4    Period  Weeks     Status  Achieved            Plan - 02/28/18 0949    Clinical Impression Statement  Patient was able to tolerate treatment well. Patient continues to demonstrate quad weakness; minimal soreness and muscle fatigue reported with all exercises. Normal reponse to modalities upon removal.    Clinical Presentation  Stable    Clinical Decision Making  Low    Rehab Potential  Good    PT Frequency  3x / week    PT Duration  4 weeks    PT Treatment/Interventions  ADLs/Self Care Home Management;Cryotherapy;Electrical Stimulation;Moist Heat;Gait training;Stair training;Functional mobility training;Therapeutic activities;Therapeutic exercise;Balance training;Patient/family education;Neuromuscular re-education;Manual techniques;Vasopneumatic Device;Taping;Passive range of motion    PT Next Visit Plan  cont eccentric quad strengthening; stair training,  knee flexion ROM and quad strengthening modalites PRN for pain relief    Consulted and Agree with Plan of Care  Patient       Patient will benefit from skilled therapeutic intervention in order to improve the following deficits and impairments:  Pain, Decreased activity tolerance, Decreased endurance, Decreased range of motion, Decreased strength, Decreased balance, Difficulty walking, Increased edema  Visit Diagnosis: Stiffness of left knee, not elsewhere classified  Difficulty in walking, not elsewhere classified  Acute pain of left knee     Problem List Patient Active Problem List   Diagnosis Date Noted  . Tobacco abuse 11/04/2017  . Primary osteoarthritis of left knee 10/29/2017  . Allergic rhinitis 03/28/2005  . Sensorineural hearing loss 03/28/2005   Guss Bunde, PT, DPT 02/28/2018, 10:44 AM  [facility] [address] [place], [place], [address] Phone: [phone]   Fax:  [phone]  Name: Famie Hearst MRN: [id number] Date of Birth: 08/08/1950

## 2018-03-01 DIAGNOSIS — J40 Bronchitis, not specified as acute or chronic: Secondary | ICD-10-CM | POA: Diagnosis not present

## 2018-03-03 ENCOUNTER — Ambulatory Visit: Payer: Medicare Other | Admitting: Physical Therapy

## 2018-03-03 ENCOUNTER — Encounter: Payer: Self-pay | Admitting: Physical Therapy

## 2018-03-03 DIAGNOSIS — M25562 Pain in left knee: Secondary | ICD-10-CM | POA: Diagnosis not present

## 2018-03-03 DIAGNOSIS — M25662 Stiffness of left knee, not elsewhere classified: Secondary | ICD-10-CM | POA: Diagnosis not present

## 2018-03-03 DIAGNOSIS — R262 Difficulty in walking, not elsewhere classified: Secondary | ICD-10-CM

## 2018-03-03 NOTE — Therapy (Addendum)
held assistance working on eccentric control. [employer] program, prone knee hangs    Consulted and Agree with Plan of Care  Patient       Patient will benefit from skilled therapeutic intervention in order to improve the following deficits and impairments:  Pain, Decreased activity tolerance, Decreased endurance, Decreased  range of motion, Decreased strength, Decreased balance, Difficulty walking, Increased edema  Visit Diagnosis: Stiffness of left knee, not elsewhere classified  Difficulty in walking, not elsewhere classified  Acute pain of left knee     Problem List Patient Active Problem List   Diagnosis Date Noted  . Tobacco abuse 11/04/2017  . Primary osteoarthritis of left knee 10/29/2017  . Allergic rhinitis 03/28/2005  . Sensorineural hearing loss 03/28/2005    Oretha Caprice, MPT 03/03/2018, 10:43 AM  [facility] [address] [place], [place], [address] Phone: [phone]   Fax:  [phone]  Name: Cheryl Gonzalez MRN: [id number] Date of Birth: 1951/05/12  held assistance working on eccentric control. [employer] program, prone knee hangs    Consulted and Agree with Plan of Care  Patient       Patient will benefit from skilled therapeutic intervention in order to improve the following deficits and impairments:  Pain, Decreased activity tolerance, Decreased endurance, Decreased  range of motion, Decreased strength, Decreased balance, Difficulty walking, Increased edema  Visit Diagnosis: Stiffness of left knee, not elsewhere classified  Difficulty in walking, not elsewhere classified  Acute pain of left knee     Problem List Patient Active Problem List   Diagnosis Date Noted  . Tobacco abuse 11/04/2017  . Primary osteoarthritis of left knee 10/29/2017  . Allergic rhinitis 03/28/2005  . Sensorineural hearing loss 03/28/2005    Oretha Caprice, MPT 03/03/2018, 10:43 AM  [facility] [address] [place], [place], [address] Phone: [phone]   Fax:  [phone]  Name: Cheryl Gonzalez MRN: [id number] Date of Birth: 1951/05/12  [facility] [place], [place], [address] Phone: [phone]   Fax:  [phone]  Physical Therapy Treatment  Patient Details  Name: Cheryl Gonzalez MRN: [id number] Date of Birth: 04-Sep-1950 Referring Provider: Cheral Almas   Encounter Date: 03/03/2018  PT End of Session - 03/03/18 0959    Visit Number  19    Number of Visits  24    Date for PT Re-Evaluation  03/24/18    Authorization Type  FOTO every 5th visit, progress note every 10th visit, KX modifier at 15th visit    PT Start Time  0944    PT Stop Time  1035    PT Time Calculation (min)  51 min    Activity Tolerance  Patient tolerated treatment well    Behavior During Therapy  [facility] for tasks assessed/performed       Past Medical History:  Diagnosis Date  . Cigarette smoker   . History of meniscal tear     Past Surgical History:  Procedure Laterality Date  . JOINT REPLACEMENT    . KNEE SURGERY Right   . SHOULDER SURGERY Left     There were no vitals filed for this visit.  Subjective Assessment - 03/03/18 0953    Subjective  Pt reporting no pain at beginning of session.     Patient is accompained by:  Family member    Pertinent History  right TKA, and previous shoulder surgery    Limitations  Sitting;Standing;Walking;House hold activities    Patient Stated Goals  walk without walker    Currently in Pain?  No/denies    Pain Orientation  Left    Pain Descriptors / Indicators  Sore soreness at times    Pain Onset  More than a month ago    Pain Frequency  Intermittent         [facility] PT Assessment - 03/03/18 0001      Assessment   Medical Diagnosis  Left total knee replacement    Onset Date/Surgical Date  12/27/17    Next MD Visit  2020    Prior Therapy  yes, home health      AROM   Left Knee Extension  -2    Left Knee Flexion  115      PROM   Left Knee Extension  0    Left Knee Flexion  118                   [facility] Adult PT  Treatment/Exercise - 03/03/18 0001      Knee/Hip Exercises: Aerobic   Stationary Bike  L4 x 15 minutes      Knee/Hip Exercises: Machines for Strengthening   Cybex Knee Extension  20# 3x10 reps    Cybex Knee Flexion  40# 3x10 reps    Cybex Leg Press  2 pl, seat 4 x30 reps eccentric control       Knee/Hip Exercises: Standing   Step Down  10 reps;Hand Hold: 2;Step Height: 6"    Wall Squat  10 reps working on Teacher, English as a foreign language Location  L knee    Electrical Stimulation Action  IFC    Electrical Stimulation Parameters  80-150 Hz x 15 minutes    Electrical Stimulation Goals  Edema      Vasopneumatic   Number Minutes Vasopneumatic   15 minutes

## 2018-03-05 ENCOUNTER — Encounter: Payer: Self-pay | Admitting: Physical Therapy

## 2018-03-05 ENCOUNTER — Ambulatory Visit: Payer: Medicare Other | Admitting: Physical Therapy

## 2018-03-05 DIAGNOSIS — R262 Difficulty in walking, not elsewhere classified: Secondary | ICD-10-CM

## 2018-03-05 DIAGNOSIS — M25562 Pain in left knee: Secondary | ICD-10-CM

## 2018-03-05 DIAGNOSIS — M25662 Stiffness of left knee, not elsewhere classified: Secondary | ICD-10-CM

## 2018-03-05 NOTE — Therapy (Signed)
[facility] [address] [place], [place], [address] Phone: [phone]   Fax:  [phone]  Physical Therapy Treatment  Progress Note Reporting Period 02/07/18 to 03/05/18  See note below for Objective Data and Assessment of Progress/Goals.      Patient Details  Name: Cheryl Gonzalez MRN: [id number] Date of Birth: 02-Jan-1951 Referring Provider: Antonieta Iba   Encounter Date: 03/05/2018  PT End of Session - 03/05/18 0916    Visit Number  20    Number of Visits  24    Date for PT Re-Evaluation  03/24/18    Authorization Type  FOTO every 5th visit, progress note every 10th visit, KX modifier at 15th visit    PT Start Time  0900    PT Stop Time  0953    PT Time Calculation (min)  53 min    Activity Tolerance  Patient tolerated treatment well    Behavior During Therapy  [facility] for tasks assessed/performed       Past Medical History:  Diagnosis Date  . Cigarette smoker   . History of meniscal tear     Past Surgical History:  Procedure Laterality Date  . JOINT REPLACEMENT    . KNEE SURGERY Right   . SHOULDER SURGERY Left     There were no vitals filed for this visit.  Subjective Assessment - 03/05/18 0916    Subjective  Patient reports improvement in function and no reports of pain.    Pertinent History  right TKA, and previous shoulder surgery    Limitations  Sitting;Standing;Walking;House hold activities    Patient Stated Goals  walk without walker    Currently in Pain?  No/denies         [facility] PT Assessment - 03/05/18 0001      Assessment   Medical Diagnosis  Left total knee replacement    Onset Date/Surgical Date  12/27/17    Next MD Visit  2020    Prior Therapy  yes, home health      Observation/Other Assessments   Focus on Therapeutic Outcomes (FOTO)   39% limited                   [facility] Adult PT Treatment/Exercise - 03/05/18 0001      Ambulation/Gait   Stairs  Yes    Stairs Assistance  6:  Modified independent (Device/Increase time)    Stair Management Technique  One rail Right;Alternating pattern;Step to pattern    Number of Stairs  4 x4    Height of Stairs  6.5      Knee/Hip Exercises: Aerobic   Stationary Bike  L4 x 15 minutes      Knee/Hip Exercises: Machines for Strengthening   Cybex Knee Extension  20# 3x10 reps    Cybex Knee Flexion  40# 3x10 reps    Cybex Leg Press  2 pl, seat 4 x30 reps eccentric control       Modalities   Modalities  Electrical Stimulation      Electrical Stimulation   Electrical Stimulation Location  L knee    Electrical Stimulation Action  IFC    Electrical Stimulation Parameters  80-150 hz x15 min    Electrical Stimulation Goals  Edema      Vasopneumatic   Number Minutes Vasopneumatic   15 minutes    Vasopnuematic Location   Knee    Vasopneumatic Pressure  Medium    Vasopneumatic Temperature   35  PT Short Term Goals - 03/03/18 1012      PT SHORT TERM GOAL #1   Title  Patient will improve left knee extension to 0 degrees to normalize gait pattern.    Time  2    Period  Weeks    Status  On-going      PT SHORT TERM GOAL #2   Title  Patient will ambulate 200 feet with straight cane or least restricted AD around clinic with minimal gait deviations    Status  Achieved        PT Long Term Goals - 03/03/18 1001      PT LONG TERM GOAL #1   Title  Patient will be independent with advanced HEP    Time  4    Period  Weeks    Status  Achieved      PT LONG TERM GOAL #2   Time  4    Period  Weeks    Status  On-going      PT LONG TERM GOAL #3   Title  Patient will demonstrate 4+/5- 5/5 strength in L LE to improve stability during functional tasks.    Time  4    Period  Weeks    Status  On-going      PT LONG TERM GOAL #4   Title  Patient will report ability to negotiate 16 steps with right railing with reciprocating pattern to safely access her bedroom.    Baseline  pt reporting being able to amb up  the stairs "fine", going down the steps pt still requires step to step gait pattern at times    Time  4    Period  Weeks    Status  On-going      PT LONG TERM GOAL #5   Title  Patient will ambulate community distances with less than 3/10 pain in L knee and no AD.    Time  4    Period  Weeks    Status  Achieved            Plan - 03/05/18 1478    Clinical Impression Statement  Patient was able to tolerate treatment well with no reports of pain. Patient still continues to have difficulties with performing reciprocating decending gait pattern with UE support but has noted an improvement. Goals are ongoing at this time AROM of left knee 2-115 degrees. Normal response to modalities at end of session.    Clinical Presentation  Stable    Clinical Decision Making  Low    Rehab Potential  Good    PT Frequency  3x / week    PT Duration  4 weeks    PT Treatment/Interventions  ADLs/Self Care Home Management;Cryotherapy;Electrical Stimulation;Moist Heat;Gait training;Stair training;Functional mobility training;Therapeutic activities;Therapeutic exercise;Balance training;Patient/family education;Neuromuscular re-education;Manual techniques;Vasopneumatic Device;Taping;Passive range of motion    PT Next Visit Plan  cont eccentric quad strengthening; stair training,  knee flexion ROM and quad strengthening modalites PRN for pain relief    Consulted and Agree with Plan of Care  Patient       Patient will benefit from skilled therapeutic intervention in order to improve the following deficits and impairments:  Pain, Decreased activity tolerance, Decreased endurance, Decreased range of motion, Decreased strength, Decreased balance, Difficulty walking, Increased edema  Visit Diagnosis: Stiffness of left knee, not elsewhere classified  Difficulty in walking, not elsewhere classified  Acute pain of left knee     Problem List Patient Active Problem List   Diagnosis  Date Noted  . Tobacco abuse  11/04/2017  . Primary osteoarthritis of left knee 10/29/2017  . Allergic rhinitis 03/28/2005  . Sensorineural hearing loss 03/28/2005    Guss Bunde 03/05/2018, 12:46 PM  [facility] [address] [place], [place], [address] Phone: [phone]   Fax:  [phone]  Name: Nini Roudebush MRN: [id number] Date of Birth: 02/27/1951

## 2018-03-07 ENCOUNTER — Ambulatory Visit: Payer: Medicare Other | Admitting: *Deleted

## 2018-03-07 DIAGNOSIS — M25562 Pain in left knee: Secondary | ICD-10-CM | POA: Diagnosis not present

## 2018-03-07 DIAGNOSIS — M25662 Stiffness of left knee, not elsewhere classified: Secondary | ICD-10-CM

## 2018-03-07 DIAGNOSIS — R262 Difficulty in walking, not elsewhere classified: Secondary | ICD-10-CM

## 2018-03-07 NOTE — Therapy (Signed)
[facility] [place], [place], [address] Phone: [phone]   Fax:  [phone]  Physical Therapy Treatment  Patient Details  Name: Cheryl Gonzalez MRN: [id number] Date of Birth: Jan 30, 1951 Referring Provider: Cheral Almas   Encounter Date: 03/07/2018  PT End of Session - 03/07/18 0938    Visit Number  21    Number of Visits  24    Date for PT Re-Evaluation  03/24/18    Authorization Type  FOTO every 5th visit, progress note every 10th visit, KX modifier at 15th visit    PT Start Time  0900    PT Stop Time  0952    PT Time Calculation (min)  52 min       Past Medical History:  Diagnosis Date  . Cigarette smoker   . History of meniscal tear     Past Surgical History:  Procedure Laterality Date  . JOINT REPLACEMENT    . KNEE SURGERY Right   . SHOULDER SURGERY Left     There were no vitals filed for this visit.  Subjective Assessment - 03/07/18 0904    Subjective  Patient reports improvement in function and no reports of pain.    Patient is accompained by:  Family member    Pertinent History  right TKA, and previous shoulder surgery    Limitations  Sitting;Standing;Walking;House hold activities    Patient Stated Goals  walk without walker    Currently in Pain?  No/denies    Pain Score  0-No pain    Pain Location  Knee    Pain Orientation  Left    Pain Descriptors / Indicators  Sore    Pain Type  Surgical pain    Pain Onset  More than a month ago    Pain Frequency  Intermittent                       [facility] Adult PT Treatment/Exercise - 03/07/18 0001      Ambulation/Gait   Stairs  Yes    Stair Management Technique  One rail Right;Alternating pattern;Step to pattern    Number of Stairs  4   x6   Height of Stairs  6.5      Knee/Hip Exercises: Aerobic   Stationary Bike  L4 x 15 minutes      Knee/Hip Exercises: Machines for Strengthening   Cybex Knee Extension  20# 3x10 reps    Cybex Knee  Flexion  40# 3x10 reps    Cybex Leg Press  2 pl, seat 4 x30 reps eccentric control       Knee/Hip Exercises: Standing   Step Down  Hand Hold: 2;Step Height: 6";20 reps      Modalities   Modalities  Electrical Stimulation      Electrical Stimulation   Electrical Stimulation Location  L knee IFC 80-150hz  x 15 mins    Electrical Stimulation Goals  Edema      Vasopneumatic   Number Minutes Vasopneumatic   15 minutes    Vasopnuematic Location   Knee    Vasopneumatic Pressure  Medium    Vasopneumatic Temperature   35               PT Short Term Goals - 03/03/18 1012      PT SHORT TERM GOAL #1   Title  Patient will improve left knee extension to 0 degrees to normalize gait pattern.    Time  2  [facility] [place], [place], [address] Phone: [phone]   Fax:  [phone]  Physical Therapy Treatment  Patient Details  Name: Cheryl Gonzalez MRN: [id number] Date of Birth: Jan 30, 1951 Referring Provider: Cheral Almas   Encounter Date: 03/07/2018  PT End of Session - 03/07/18 0938    Visit Number  21    Number of Visits  24    Date for PT Re-Evaluation  03/24/18    Authorization Type  FOTO every 5th visit, progress note every 10th visit, KX modifier at 15th visit    PT Start Time  0900    PT Stop Time  0952    PT Time Calculation (min)  52 min       Past Medical History:  Diagnosis Date  . Cigarette smoker   . History of meniscal tear     Past Surgical History:  Procedure Laterality Date  . JOINT REPLACEMENT    . KNEE SURGERY Right   . SHOULDER SURGERY Left     There were no vitals filed for this visit.  Subjective Assessment - 03/07/18 0904    Subjective  Patient reports improvement in function and no reports of pain.    Patient is accompained by:  Family member    Pertinent History  right TKA, and previous shoulder surgery    Limitations  Sitting;Standing;Walking;House hold activities    Patient Stated Goals  walk without walker    Currently in Pain?  No/denies    Pain Score  0-No pain    Pain Location  Knee    Pain Orientation  Left    Pain Descriptors / Indicators  Sore    Pain Type  Surgical pain    Pain Onset  More than a month ago    Pain Frequency  Intermittent                       [facility] Adult PT Treatment/Exercise - 03/07/18 0001      Ambulation/Gait   Stairs  Yes    Stair Management Technique  One rail Right;Alternating pattern;Step to pattern    Number of Stairs  4   x6   Height of Stairs  6.5      Knee/Hip Exercises: Aerobic   Stationary Bike  L4 x 15 minutes      Knee/Hip Exercises: Machines for Strengthening   Cybex Knee Extension  20# 3x10 reps    Cybex Knee  Flexion  40# 3x10 reps    Cybex Leg Press  2 pl, seat 4 x30 reps eccentric control       Knee/Hip Exercises: Standing   Step Down  Hand Hold: 2;Step Height: 6";20 reps      Modalities   Modalities  Electrical Stimulation      Electrical Stimulation   Electrical Stimulation Location  L knee IFC 80-150hz  x 15 mins    Electrical Stimulation Goals  Edema      Vasopneumatic   Number Minutes Vasopneumatic   15 minutes    Vasopnuematic Location   Knee    Vasopneumatic Pressure  Medium    Vasopneumatic Temperature   35               PT Short Term Goals - 03/03/18 1012      PT SHORT TERM GOAL #1   Title  Patient will improve left knee extension to 0 degrees to normalize gait pattern.    Time  2  PTA 03/07/2018, 9:44 AM  [facility] [place], [place], [address] Phone: [phone]   Fax:  [phone]  Name: Cheryl Gonzalez MRN: [id number] Date of Birth: 06/07/1951

## 2018-03-10 ENCOUNTER — Encounter: Payer: Self-pay | Admitting: Physical Therapy

## 2018-03-10 ENCOUNTER — Ambulatory Visit: Payer: Medicare Other | Admitting: Physical Therapy

## 2018-03-10 DIAGNOSIS — M25662 Stiffness of left knee, not elsewhere classified: Secondary | ICD-10-CM

## 2018-03-10 DIAGNOSIS — R262 Difficulty in walking, not elsewhere classified: Secondary | ICD-10-CM | POA: Diagnosis not present

## 2018-03-10 DIAGNOSIS — M25562 Pain in left knee: Secondary | ICD-10-CM

## 2018-03-10 NOTE — Therapy (Signed)
Hassell Done, MPT 03/10/2018, 12:36 PM  [facility] [place], [place], [address] Phone: [phone]   Fax:  [phone]  Name: [name] MRN: [id number] Date of Birth: 03/26/1951  [facility] [place], [place], [address] Phone: [phone]   Fax:  [phone]  Physical Therapy Treatment  Patient Details  Name: Cheryl Gonzalez MRN: [id number] Date of Birth: Aug 28, 1950 Referring Provider: Cheral Almas   Encounter Date: 03/10/2018  PT End of Session - 03/10/18 1232    Visit Number  22    Number of Visits  24    Date for PT Re-Evaluation  03/24/18    Authorization Type  FOTO every 5th visit, progress note every 10th visit, KX modifier at 15th visit    PT Start Time  0900    PT Stop Time  0952    PT Time Calculation (min)  52 min    Activity Tolerance  Patient tolerated treatment well    Behavior During Therapy  [facility] for tasks assessed/performed       Past Medical History:  Diagnosis Date  . Cigarette smoker   . History of meniscal tear     Past Surgical History:  Procedure Laterality Date  . JOINT REPLACEMENT    . KNEE SURGERY Right   . SHOULDER SURGERY Left     There were no vitals filed for this visit.  Subjective Assessment - 03/10/18 1231    Subjective  Pt reporting no pain at rest.     Pertinent History  right TKA, and previous shoulder surgery    Limitations  Sitting;Standing;Walking;House hold activities    Patient Stated Goals  walk without walker    Currently in Pain?  No/denies    Pain Onset  More than a month ago    Pain Frequency  Intermittent         [facility] PT Assessment - 03/10/18 0001      Assessment   Medical Diagnosis  left total knee replacement    Onset Date/Surgical Date  12/27/17    Next MD Visit  next year                   [facility] Adult PT Treatment/Exercise - 03/10/18 0001      Knee/Hip Exercises: Aerobic   Stationary Bike  L4 x 15 minutes      Knee/Hip Exercises: Machines for Strengthening   Cybex Knee Extension  20# 3x10 reps    Cybex Knee Flexion  40# 3x10 reps    Cybex Leg Press  3 pl, seat 6 x 40 reps      Knee/Hip Exercises: Standing   Forward Lunges  Left;10 reps;3 seconds    Step Down  Hand Hold: 2;Step Height: 6";20 reps    Wall Squat  15 reps    Rocker Board  2 minutes      Modalities   Modalities  Electrical Stimulation      Electrical Stimulation   Electrical Stimulation Location  L knee IFC 80-150hz  x 15 mins    Electrical Stimulation Action  IFC    Electrical Stimulation Parameters  80-150 Hz x 15 minutes    Electrical Stimulation Goals  Edema      Vasopneumatic   Number Minutes Vasopneumatic   15 minutes    Vasopnuematic Location   Knee    Vasopneumatic Pressure  Medium    Vasopneumatic Temperature   34               PT Short Term Goals - 03/03/18 1012      PT SHORT TERM GOAL #1   Title  Patient will improve left knee extension to 0 degrees to normalize  [facility] [place], [place], [address] Phone: [phone]   Fax:  [phone]  Physical Therapy Treatment  Patient Details  Name: Cheryl Gonzalez MRN: [id number] Date of Birth: Aug 28, 1950 Referring Provider: Cheral Almas   Encounter Date: 03/10/2018  PT End of Session - 03/10/18 1232    Visit Number  22    Number of Visits  24    Date for PT Re-Evaluation  03/24/18    Authorization Type  FOTO every 5th visit, progress note every 10th visit, KX modifier at 15th visit    PT Start Time  0900    PT Stop Time  0952    PT Time Calculation (min)  52 min    Activity Tolerance  Patient tolerated treatment well    Behavior During Therapy  [facility] for tasks assessed/performed       Past Medical History:  Diagnosis Date  . Cigarette smoker   . History of meniscal tear     Past Surgical History:  Procedure Laterality Date  . JOINT REPLACEMENT    . KNEE SURGERY Right   . SHOULDER SURGERY Left     There were no vitals filed for this visit.  Subjective Assessment - 03/10/18 1231    Subjective  Pt reporting no pain at rest.     Pertinent History  right TKA, and previous shoulder surgery    Limitations  Sitting;Standing;Walking;House hold activities    Patient Stated Goals  walk without walker    Currently in Pain?  No/denies    Pain Onset  More than a month ago    Pain Frequency  Intermittent         [facility] PT Assessment - 03/10/18 0001      Assessment   Medical Diagnosis  left total knee replacement    Onset Date/Surgical Date  12/27/17    Next MD Visit  next year                   [facility] Adult PT Treatment/Exercise - 03/10/18 0001      Knee/Hip Exercises: Aerobic   Stationary Bike  L4 x 15 minutes      Knee/Hip Exercises: Machines for Strengthening   Cybex Knee Extension  20# 3x10 reps    Cybex Knee Flexion  40# 3x10 reps    Cybex Leg Press  3 pl, seat 6 x 40 reps      Knee/Hip Exercises: Standing   Forward Lunges  Left;10 reps;3 seconds    Step Down  Hand Hold: 2;Step Height: 6";20 reps    Wall Squat  15 reps    Rocker Board  2 minutes      Modalities   Modalities  Electrical Stimulation      Electrical Stimulation   Electrical Stimulation Location  L knee IFC 80-150hz  x 15 mins    Electrical Stimulation Action  IFC    Electrical Stimulation Parameters  80-150 Hz x 15 minutes    Electrical Stimulation Goals  Edema      Vasopneumatic   Number Minutes Vasopneumatic   15 minutes    Vasopnuematic Location   Knee    Vasopneumatic Pressure  Medium    Vasopneumatic Temperature   34               PT Short Term Goals - 03/03/18 1012      PT SHORT TERM GOAL #1   Title  Patient will improve left knee extension to 0 degrees to normalize

## 2018-03-11 ENCOUNTER — Encounter: Payer: Medicare Other | Admitting: *Deleted

## 2018-03-13 ENCOUNTER — Ambulatory Visit: Payer: Medicare Other | Admitting: *Deleted

## 2018-03-13 DIAGNOSIS — R262 Difficulty in walking, not elsewhere classified: Secondary | ICD-10-CM | POA: Diagnosis not present

## 2018-03-13 DIAGNOSIS — M25662 Stiffness of left knee, not elsewhere classified: Secondary | ICD-10-CM

## 2018-03-13 DIAGNOSIS — M25562 Pain in left knee: Secondary | ICD-10-CM | POA: Diagnosis not present

## 2018-03-13 NOTE — Therapy (Signed)
Cheryl Gonzalez, Cheryl Gonzalez, [address] Phone: [phone]   Fax:  [phone]  Physical Therapy Treatment  Patient Details  Name: Cheryl Gonzalez MRN: [id number] Date of Birth: [birth year] Referring Provider: Cheral Gonzalez   Encounter Date: 03/13/2018  PT End of Session - 03/13/18 0911    Visit Number  23    Number of Visits  24    Date for PT Re-Evaluation  03/24/18    Authorization Type  FOTO every 5th visit, progress note every 10th visit, KX modifier at 15th visit    PT Start Time  0900    PT Stop Time  0955    PT Time Calculation (min)  55 min       Past Medical History:  Diagnosis Date  . Cigarette smoker   . History of meniscal tear     Past Surgical History:  Procedure Laterality Date  . JOINT REPLACEMENT    . KNEE SURGERY Right   . SHOULDER SURGERY Left     There were no vitals filed for this visit.  Subjective Assessment - 03/13/18 0910    Subjective  Pt reporting no pain at rest.     Patient is accompained by:  Family member    Pertinent History  right TKA, and previous shoulder surgery    Limitations  Sitting;Standing;Walking;House hold activities    Patient Stated Goals  walk without walker    Currently in Pain?  No/denies    Pain Location  Knee    Pain Orientation  Left    Pain Descriptors / Indicators  Sore    Pain Onset  More than a month ago    Pain Frequency  Intermittent                       [facility] Adult PT Treatment/Exercise - 03/13/18 0001      Ambulation/Gait   Stair Management Technique  No rails    Number of Stairs  4   x6   Height of Stairs  6.5      Knee/Hip Exercises: Aerobic   Stationary Bike  L4 x 15 minutes      Knee/Hip Exercises: Machines for Strengthening   Cybex Knee Extension  20# 3x10 reps    Cybex Knee Flexion  40# 3x10 reps    Cybex Leg Press  3 pl, seat 6 x 40 reps      Knee/Hip Exercises: Standing   Forward Lunges  Left;10 reps;3 seconds     Rocker Board  2 minutes      Modalities   Modalities  Electrical Stimulation      Electrical Stimulation   Electrical Stimulation Location  L knee IFC 80-150hz  x 15 mins    Electrical Stimulation Goals  Edema      Vasopneumatic   Number Minutes Vasopneumatic   15 minutes    Vasopnuematic Location   Knee    Vasopneumatic Pressure  Medium    Vasopneumatic Temperature   34               PT Short Term Goals - 03/03/18 1012      PT SHORT TERM GOAL #1   Title  Patient will improve left knee extension to 0 degrees to normalize gait pattern.    Time  2    Period  Weeks    Status  On-going      PT SHORT TERM GOAL #2   Title  Patient will ambulate  loss 03/28/2005    Cheryl Gonzalez,Cheryl Gonzalez 03/13/2018, 9:58 AM  Cheryl Gonzalez [address] Cheryl Gonzalez, Cheryl Gonzalez,  [address] Phone: [phone]   Fax:  [phone]  Name: Cheryl Gonzalez MRN: [id number] Date of Birth: Cheryl Gonzalez  Cheryl Gonzalez, Cheryl Gonzalez, [address] Phone: [phone]   Fax:  [phone]  Physical Therapy Treatment  Patient Details  Name: Cheryl Gonzalez MRN: [id number] Date of Birth: [birth year] Referring Provider: Cheral Gonzalez   Encounter Date: 03/13/2018  PT End of Session - 03/13/18 0911    Visit Number  23    Number of Visits  24    Date for PT Re-Evaluation  03/24/18    Authorization Type  FOTO every 5th visit, progress note every 10th visit, KX modifier at 15th visit    PT Start Time  0900    PT Stop Time  0955    PT Time Calculation (min)  55 min       Past Medical History:  Diagnosis Date  . Cigarette smoker   . History of meniscal tear     Past Surgical History:  Procedure Laterality Date  . JOINT REPLACEMENT    . KNEE SURGERY Right   . SHOULDER SURGERY Left     There were no vitals filed for this visit.  Subjective Assessment - 03/13/18 0910    Subjective  Pt reporting no pain at rest.     Patient is accompained by:  Family member    Pertinent History  right TKA, and previous shoulder surgery    Limitations  Sitting;Standing;Walking;House hold activities    Patient Stated Goals  walk without walker    Currently in Pain?  No/denies    Pain Location  Knee    Pain Orientation  Left    Pain Descriptors / Indicators  Sore    Pain Onset  More than a month ago    Pain Frequency  Intermittent                       [facility] Adult PT Treatment/Exercise - 03/13/18 0001      Ambulation/Gait   Stair Management Technique  No rails    Number of Stairs  4   x6   Height of Stairs  6.5      Knee/Hip Exercises: Aerobic   Stationary Bike  L4 x 15 minutes      Knee/Hip Exercises: Machines for Strengthening   Cybex Knee Extension  20# 3x10 reps    Cybex Knee Flexion  40# 3x10 reps    Cybex Leg Press  3 pl, seat 6 x 40 reps      Knee/Hip Exercises: Standing   Forward Lunges  Left;10 reps;3 seconds     Rocker Board  2 minutes      Modalities   Modalities  Electrical Stimulation      Electrical Stimulation   Electrical Stimulation Location  L knee IFC 80-150hz  x 15 mins    Electrical Stimulation Goals  Edema      Vasopneumatic   Number Minutes Vasopneumatic   15 minutes    Vasopnuematic Location   Knee    Vasopneumatic Pressure  Medium    Vasopneumatic Temperature   34               PT Short Term Goals - 03/03/18 1012      PT SHORT TERM GOAL #1   Title  Patient will improve left knee extension to 0 degrees to normalize gait pattern.    Time  2    Period  Weeks    Status  On-going      PT SHORT TERM GOAL #2   Title  Patient will ambulate

## 2018-03-13 NOTE — Therapy (Deleted)
[facility] [place], [place], [address] Phone: [phone]   Fax:  [phone]  Patient Details  Name: Aerielle Stoklosa MRN: [id number] Date of Birth: 1950-12-27 Referring Provider:  Janora Norlander, DO  Encounter Date: 03/13/2018   RAMSEUR,CHRIS 03/13/2018, 9:57 AM  [facility] [place], [place], [address] Phone: [phone]   Fax:  [phone]

## 2018-03-17 ENCOUNTER — Encounter: Payer: Self-pay | Admitting: Physical Therapy

## 2018-03-17 ENCOUNTER — Ambulatory Visit: Payer: Medicare Other | Admitting: Physical Therapy

## 2018-03-17 DIAGNOSIS — M25662 Stiffness of left knee, not elsewhere classified: Secondary | ICD-10-CM

## 2018-03-17 DIAGNOSIS — R262 Difficulty in walking, not elsewhere classified: Secondary | ICD-10-CM | POA: Diagnosis not present

## 2018-03-17 DIAGNOSIS — M25562 Pain in left knee: Secondary | ICD-10-CM

## 2018-03-17 NOTE — Therapy (Signed)
[facility] [place], [place], [address] Phone: [phone]   Fax:  [phone]  Physical Therapy Treatment/Discharge  Patient Details  Name: Cheryl Gonzalez MRN: [id number] Date of Birth: 10-23-1950 Referring Provider: Cheral Almas   Encounter Date: 03/17/2018  PT End of Session - 03/17/18 0905    Visit Number  24    Number of Visits  24    Date for PT Re-Evaluation  03/24/18    Authorization Type  FOTO every 5th visit, progress note every 10th visit, KX modifier at 15th visit    PT Start Time  0901    PT Stop Time  0950    PT Time Calculation (min)  49 min    Activity Tolerance  Patient tolerated treatment well    Behavior During Therapy  [facility] for tasks assessed/performed       Past Medical History:  Diagnosis Date  . Cigarette smoker   . History of meniscal tear     Past Surgical History:  Procedure Laterality Date  . JOINT REPLACEMENT    . KNEE SURGERY Right   . SHOULDER SURGERY Left     There were no vitals filed for this visit.  Subjective Assessment - 03/17/18 0904    Subjective  Reports that she is improving with descending stairs.    Pertinent History  right TKA, and previous shoulder surgery    Limitations  Sitting;Standing;Walking;House hold activities    Patient Stated Goals  walk without walker    Currently in Pain?  No/denies         [facility] PT Assessment - 03/17/18 0001      Assessment   Medical Diagnosis  left total knee replacement    Onset Date/Surgical Date  12/27/17    Next MD Visit  2020    Prior Therapy  yes, home health      ROM / Strength   AROM / PROM / Strength  AROM;Strength      AROM   Overall AROM   Within functional limits for tasks performed    AROM Assessment Site  Knee    Right/Left Knee  Left    Left Knee Extension  0    Left Knee Flexion  122      Strength   Overall Strength  Within functional limits for tasks performed    Strength Assessment Site  Knee;Hip    Right/Left Hip  Left    Left Hip Flexion  4/5    Left Hip ABduction  4+/5    Right/Left Knee  Left    Left Knee Flexion  4+/5    Left Knee Extension  4+/5                   [facility] Adult PT Treatment/Exercise - 03/17/18 0001      Ambulation/Gait   Stairs  Yes    Stairs Assistance  6: Modified independent (Device/Increase time)    Stair Management Technique  No rails;Alternating pattern;Forwards    Number of Stairs  4   x3 RT   Height of Stairs  6.5      Knee/Hip Exercises: Aerobic   Stationary Bike  L4 x13 min      Knee/Hip Exercises: Machines for Strengthening   Cybex Knee Extension  20# 3x10 reps    Cybex Knee Flexion  40# 3x10 reps    Cybex Leg Press  3 pl, seat 5 x30 reps      Knee/Hip Exercises: Standing   Wall  PT Duration  4 weeks    PT Treatment/Interventions  ADLs/Self Care Home Management;Cryotherapy;Electrical Stimulation;Moist Heat;Gait training;Stair training;Functional mobility training;Therapeutic activities;Therapeutic exercise;Balance training;Patient/family education;Neuromuscular re-education;Manual techniques;Vasopneumatic Device;Taping;Passive range of motion    PT Next Visit Plan  D/C summary required.    PT Home Exercise Plan  heel prop  with weight, up and down stairs with hand held assistance working on eccentric control. [employer] program, prone knee hangs    Consulted and Agree with Plan of Care  Patient       Patient will benefit from skilled therapeutic intervention in order to improve the following deficits and impairments:  Pain, Decreased activity tolerance, Decreased endurance, Decreased range of motion, Decreased strength, Decreased balance, Difficulty walking, Increased edema  Visit Diagnosis: Stiffness of left knee, not elsewhere classified  Difficulty in walking, not elsewhere classified  Acute pain of left knee     Problem List Patient Active Problem List   Diagnosis Date Noted  . Tobacco abuse 11/04/2017  . Primary osteoarthritis of left knee 10/29/2017  . Allergic rhinitis 03/28/2005  . Sensorineural hearing loss 03/28/2005    PHYSICAL THERAPY DISCHARGE SUMMARY  Visits from Start of Care: 24   Current functional level related to goals / functional outcomes: See above    Remaining deficits: Goals partially met, see above   Education / Equipment: HEP Plan: Patient agrees to discharge.  Patient goals were partially met. Patient is being discharged due to meeting the stated rehab goals.  ?????      [name], [place] 03/17/18 10:03 AM   [facility] [address] [place], [place], [address] Phone: [phone]   Fax:  [phone]  Name: Cheryl Gonzalez MRN: [id number] Date of Birth: 1950/11/20  [facility] [place], [place], [address] Phone: [phone]   Fax:  [phone]  Physical Therapy Treatment/Discharge  Patient Details  Name: Cheryl Gonzalez MRN: [id number] Date of Birth: 10-23-1950 Referring Provider: Cheral Almas   Encounter Date: 03/17/2018  PT End of Session - 03/17/18 0905    Visit Number  24    Number of Visits  24    Date for PT Re-Evaluation  03/24/18    Authorization Type  FOTO every 5th visit, progress note every 10th visit, KX modifier at 15th visit    PT Start Time  0901    PT Stop Time  0950    PT Time Calculation (min)  49 min    Activity Tolerance  Patient tolerated treatment well    Behavior During Therapy  [facility] for tasks assessed/performed       Past Medical History:  Diagnosis Date  . Cigarette smoker   . History of meniscal tear     Past Surgical History:  Procedure Laterality Date  . JOINT REPLACEMENT    . KNEE SURGERY Right   . SHOULDER SURGERY Left     There were no vitals filed for this visit.  Subjective Assessment - 03/17/18 0904    Subjective  Reports that she is improving with descending stairs.    Pertinent History  right TKA, and previous shoulder surgery    Limitations  Sitting;Standing;Walking;House hold activities    Patient Stated Goals  walk without walker    Currently in Pain?  No/denies         [facility] PT Assessment - 03/17/18 0001      Assessment   Medical Diagnosis  left total knee replacement    Onset Date/Surgical Date  12/27/17    Next MD Visit  2020    Prior Therapy  yes, home health      ROM / Strength   AROM / PROM / Strength  AROM;Strength      AROM   Overall AROM   Within functional limits for tasks performed    AROM Assessment Site  Knee    Right/Left Knee  Left    Left Knee Extension  0    Left Knee Flexion  122      Strength   Overall Strength  Within functional limits for tasks performed    Strength Assessment Site  Knee;Hip    Right/Left Hip  Left    Left Hip Flexion  4/5    Left Hip ABduction  4+/5    Right/Left Knee  Left    Left Knee Flexion  4+/5    Left Knee Extension  4+/5                   [facility] Adult PT Treatment/Exercise - 03/17/18 0001      Ambulation/Gait   Stairs  Yes    Stairs Assistance  6: Modified independent (Device/Increase time)    Stair Management Technique  No rails;Alternating pattern;Forwards    Number of Stairs  4   x3 RT   Height of Stairs  6.5      Knee/Hip Exercises: Aerobic   Stationary Bike  L4 x13 min      Knee/Hip Exercises: Machines for Strengthening   Cybex Knee Extension  20# 3x10 reps    Cybex Knee Flexion  40# 3x10 reps    Cybex Leg Press  3 pl, seat 5 x30 reps      Knee/Hip Exercises: Standing   Wall

## 2018-04-03 DIAGNOSIS — L821 Other seborrheic keratosis: Secondary | ICD-10-CM | POA: Diagnosis not present

## 2018-04-03 DIAGNOSIS — L905 Scar conditions and fibrosis of skin: Secondary | ICD-10-CM | POA: Diagnosis not present

## 2018-04-03 DIAGNOSIS — Z85820 Personal history of malignant melanoma of skin: Secondary | ICD-10-CM | POA: Diagnosis not present

## 2018-04-03 DIAGNOSIS — B079 Viral wart, unspecified: Secondary | ICD-10-CM | POA: Diagnosis not present

## 2018-04-24 DIAGNOSIS — B009 Herpesviral infection, unspecified: Secondary | ICD-10-CM | POA: Diagnosis not present

## 2018-04-24 DIAGNOSIS — L570 Actinic keratosis: Secondary | ICD-10-CM | POA: Diagnosis not present

## 2018-04-24 DIAGNOSIS — B079 Viral wart, unspecified: Secondary | ICD-10-CM | POA: Diagnosis not present

## 2018-04-24 DIAGNOSIS — S80862A Insect bite (nonvenomous), left lower leg, initial encounter: Secondary | ICD-10-CM | POA: Diagnosis not present

## 2018-04-24 DIAGNOSIS — L821 Other seborrheic keratosis: Secondary | ICD-10-CM | POA: Diagnosis not present

## 2018-04-24 DIAGNOSIS — L905 Scar conditions and fibrosis of skin: Secondary | ICD-10-CM | POA: Diagnosis not present

## 2018-04-24 DIAGNOSIS — Z85820 Personal history of malignant melanoma of skin: Secondary | ICD-10-CM | POA: Diagnosis not present

## 2018-08-02 DIAGNOSIS — J019 Acute sinusitis, unspecified: Secondary | ICD-10-CM | POA: Diagnosis not present

## 2018-09-14 IMAGING — DX DG CHEST 2V
2 series · 2 of 2 positions shown · non-contrast
Comparison: None.

CLINICAL DATA: Pre-op respiratory exam for knee surgery.

EXAM:
CHEST - 2 VIEW

[chest pa]
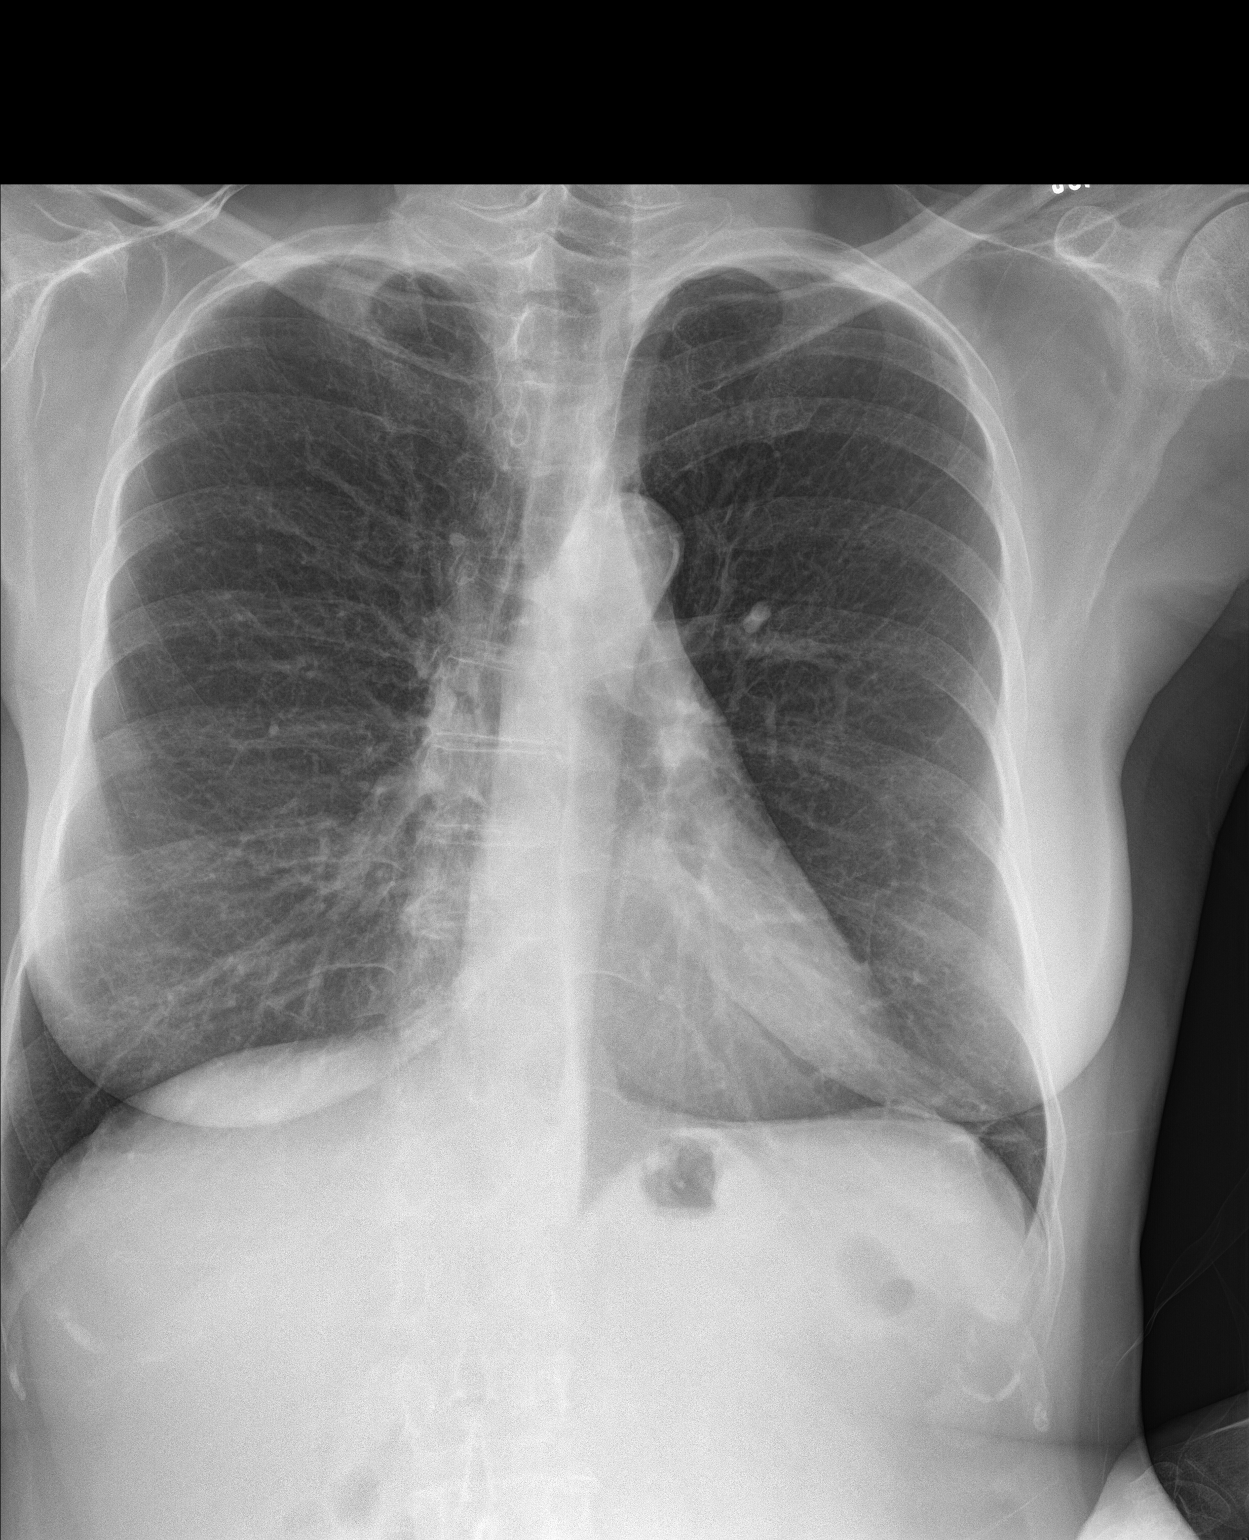

[chest lat]
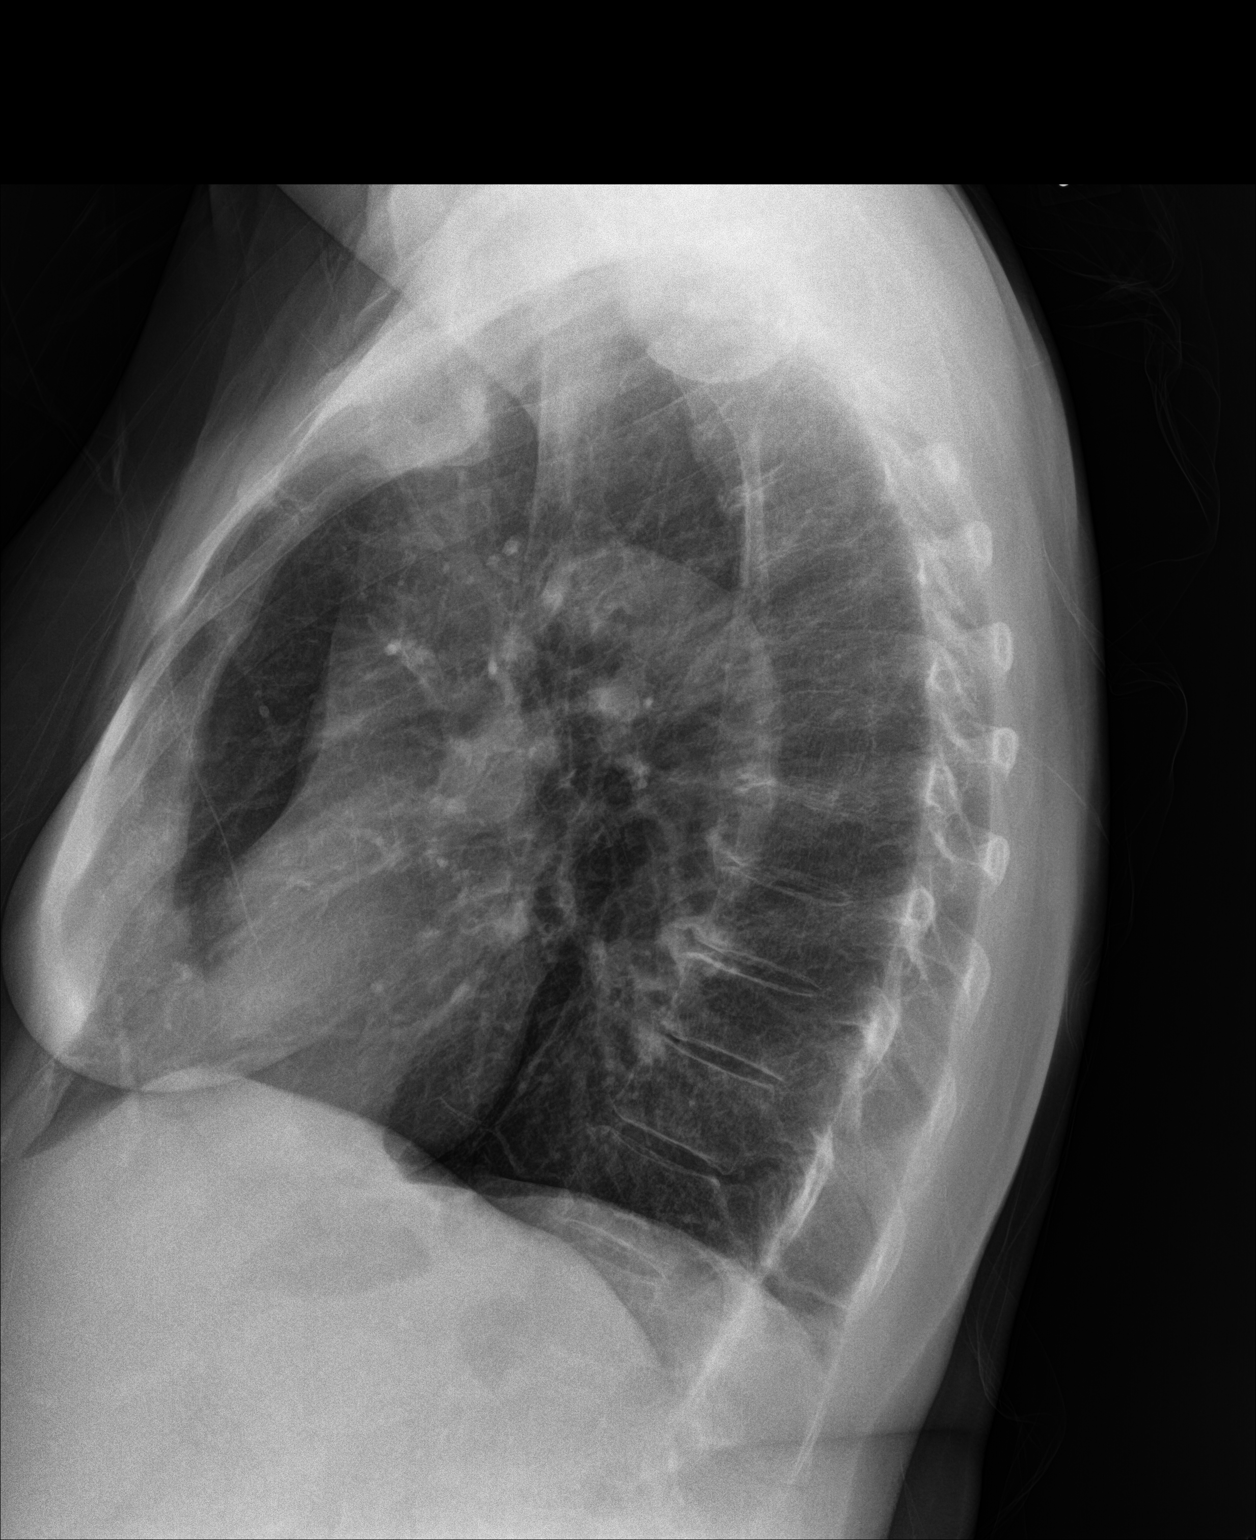

[2 of 2 positions shown; findings below may reference images not displayed]

FINDINGS: The heart size and mediastinal contours are within normal limits.
Aortic atherosclerosis. Both lungs are clear. Pulmonary
hyperinflation is suspicious for COPD. The visualized skeletal
structures are unremarkable.
IMPRESSION: Probable COPD.  No active cardiopulmonary disease.

## 2018-10-21 DIAGNOSIS — L905 Scar conditions and fibrosis of skin: Secondary | ICD-10-CM | POA: Diagnosis not present

## 2018-10-21 DIAGNOSIS — Z85820 Personal history of malignant melanoma of skin: Secondary | ICD-10-CM | POA: Diagnosis not present

## 2018-12-09 DIAGNOSIS — Z03818 Encounter for observation for suspected exposure to other biological agents ruled out: Secondary | ICD-10-CM | POA: Diagnosis not present

## 2019-02-18 DIAGNOSIS — L821 Other seborrheic keratosis: Secondary | ICD-10-CM | POA: Diagnosis not present

## 2019-02-18 DIAGNOSIS — L905 Scar conditions and fibrosis of skin: Secondary | ICD-10-CM | POA: Diagnosis not present

## 2019-02-18 DIAGNOSIS — B009 Herpesviral infection, unspecified: Secondary | ICD-10-CM | POA: Diagnosis not present

## 2019-02-18 DIAGNOSIS — L648 Other androgenic alopecia: Secondary | ICD-10-CM | POA: Diagnosis not present

## 2019-02-18 DIAGNOSIS — Z85820 Personal history of malignant melanoma of skin: Secondary | ICD-10-CM | POA: Diagnosis not present

## 2019-02-18 DIAGNOSIS — D225 Melanocytic nevi of trunk: Secondary | ICD-10-CM | POA: Diagnosis not present

## 2019-02-26 ENCOUNTER — Other Ambulatory Visit: Payer: Self-pay

## 2019-05-08 DIAGNOSIS — Z96652 Presence of left artificial knee joint: Secondary | ICD-10-CM | POA: Diagnosis not present

## 2019-05-08 DIAGNOSIS — Z96653 Presence of artificial knee joint, bilateral: Secondary | ICD-10-CM | POA: Diagnosis not present

## 2019-05-08 DIAGNOSIS — M25462 Effusion, left knee: Secondary | ICD-10-CM | POA: Diagnosis not present

## 2019-05-08 DIAGNOSIS — M1712 Unilateral primary osteoarthritis, left knee: Secondary | ICD-10-CM | POA: Diagnosis not present

## 2019-07-16 ENCOUNTER — Other Ambulatory Visit: Payer: Self-pay

## 2019-07-17 ENCOUNTER — Encounter: Payer: Self-pay | Admitting: Family Medicine

## 2019-07-17 ENCOUNTER — Ambulatory Visit (INDEPENDENT_AMBULATORY_CARE_PROVIDER_SITE_OTHER): Payer: Medicare Other | Admitting: Family Medicine

## 2019-07-17 VITALS — BP 138/72 | HR 85 | Temp 98.0°F | Ht 66.0 in | Wt 153.0 lb

## 2019-07-17 DIAGNOSIS — Z23 Encounter for immunization: Secondary | ICD-10-CM

## 2019-07-17 DIAGNOSIS — Z96653 Presence of artificial knee joint, bilateral: Secondary | ICD-10-CM | POA: Diagnosis not present

## 2019-07-17 DIAGNOSIS — Z9889 Other specified postprocedural states: Secondary | ICD-10-CM

## 2019-07-17 DIAGNOSIS — Z720 Tobacco use: Secondary | ICD-10-CM | POA: Diagnosis not present

## 2019-07-17 DIAGNOSIS — M25512 Pain in left shoulder: Secondary | ICD-10-CM

## 2019-07-17 DIAGNOSIS — G8929 Other chronic pain: Secondary | ICD-10-CM

## 2019-07-17 DIAGNOSIS — E782 Mixed hyperlipidemia: Secondary | ICD-10-CM

## 2019-07-17 MED ORDER — CHANTIX STARTING MONTH PAK 0.5 MG X 11 & 1 MG X 42 PO TABS: ORAL_TABLET | ORAL | 0 refills | Status: AC

## 2019-07-17 MED ORDER — VARENICLINE TARTRATE 1 MG PO TABS: 1.0000 mg | ORAL_TABLET | Freq: Two times a day (BID) | ORAL | 2 refills | Status: AC

## 2019-07-17 NOTE — Patient Instructions (Signed)
You had labs performed today.  You will be contacted with the results of the labs once they are available, usually in the next 3 business days for routine lab work.  If you have an active my chart account, they will be released to your MyChart.  If you prefer to have these labs released to you via telephone, please let us know.  If you had a pap smear or biopsy performed, expect to be contacted in about 7-10 days.   Steps to Quit Smoking Smoking tobacco is the leading cause of preventable death. It can affect almost every organ in the body. Smoking puts you and people around you at risk for many serious, long-lasting (chronic) diseases. Quitting smoking can be hard, but it is one of the best things that you can do for your health. It is never too late to quit. How do I get ready to quit? When you decide to quit smoking, make a plan to help you succeed. Before you quit:  Pick a date to quit. Set a date within the next 2 weeks to give you time to prepare.  Write down the reasons why you are quitting. Keep this list in places where you will see it often.  Tell your family, friends, and co-workers that you are quitting. Their support is important.  Talk with your doctor about the choices that may help you quit.  Find out if your health insurance will pay for these treatments.  Know the people, places, things, and activities that make you want to smoke (triggers). Avoid them. What first steps can I take to quit smoking?  Throw away all cigarettes at home, at work, and in your car.  Throw away the things that you use when you smoke, such as ashtrays and lighters.  Clean your car. Make sure to empty the ashtray.  Clean your home, including curtains and carpets. What can I do to help me quit smoking? Talk with your doctor about taking medicines and seeing a counselor at the same time. You are more likely to succeed when you do both.  If you are pregnant or breastfeeding, talk with your  doctor about counseling or other ways to quit smoking. Do not take medicine to help you quit smoking unless your doctor tells you to do so. To quit smoking: Quit right away  Quit smoking totally, instead of slowly cutting back on how much you smoke over a period of time.  Go to counseling. You are more likely to quit if you go to counseling sessions regularly. Take medicine You may take medicines to help you quit. Some medicines need a prescription, and some you can buy over-the-counter. Some medicines may contain a drug called nicotine to replace the nicotine in cigarettes. Medicines may:  Help you to stop having the desire to smoke (cravings).  Help to stop the problems that come when you stop smoking (withdrawal symptoms). Your doctor may ask you to use:  Nicotine patches, gum, or lozenges.  Nicotine inhalers or sprays.  Non-nicotine medicine that is taken by mouth. Find resources Find resources and other ways to help you quit smoking and remain smoke-free after you quit. These resources are most helpful when you use them often. They include:  Online chats with a Social worker.  Phone quitlines.  Printed Furniture conservator/restorer.  Support groups or group counseling.  Text messaging programs.  Mobile phone apps. Use apps on your mobile phone or tablet that can help you stick to your quit plan. There  are many free apps for mobile phones and tablets as well as websites. Examples include Quit Guide from the [employer] and smokefree.gov  What things can I do to make it easier to quit?   Talk to your family and friends. Ask them to support and encourage you.  Call a phone quitline (1-800-QUIT-NOW), reach out to support groups, or work with a Social worker.  Ask people who smoke to not smoke around you.  Avoid places that make you want to smoke, such as: ? Bars. ? Parties. ? Smoke-break areas at work.  Spend time with people who do not smoke.  Lower the stress in your life. Stress can make  you want to smoke. Try these things to help your stress: ? Getting regular exercise. ? Doing deep-breathing exercises. ? Doing yoga. ? Meditating. ? Doing a body scan. To do this, close your eyes, focus on one area of your body at a time from head to toe. Notice which parts of your body are tense. Try to relax the muscles in those areas. How will I feel when I quit smoking? Day 1 to 3 weeks Within the first 24 hours, you may start to have some problems that come from quitting tobacco. These problems are very bad 2-3 days after you quit, but they do not often last for more than 2-3 weeks. You may get these symptoms:  Mood swings.  Feeling restless, nervous, angry, or annoyed.  Trouble concentrating.  Dizziness.  Strong desire for high-sugar foods and nicotine.  Weight gain.  Trouble pooping (constipation).  Feeling like you may vomit (nausea).  Coughing or a sore throat.  Changes in how the medicines that you take for other issues work in your body.  Depression.  Trouble sleeping (insomnia). Week 3 and afterward After the first 2-3 weeks of quitting, you may start to notice more positive results, such as:  Better sense of smell and taste.  Less coughing and sore throat.  Slower heart rate.  Lower blood pressure.  Clearer skin.  Better breathing.  Fewer sick days. Quitting smoking can be hard. Do not give up if you fail the first time. Some people need to try a few times before they succeed. Do your best to stick to your quit plan, and talk with your doctor if you have any questions or concerns. Summary  Smoking tobacco is the leading cause of preventable death. Quitting smoking can be hard, but it is one of the best things that you can do for your health.  When you decide to quit smoking, make a plan to help you succeed.  Quit smoking right away, not slowly over a period of time.  When you start quitting, seek help from your doctor, family, or friends. This  information is not intended to replace advice given to you by your health care provider. Make sure you discuss any questions you have with your health care provider. Document Released: 05/12/2009 Document Revised: 10/03/2018 Document Reviewed: 10/04/2018 Elsevier Patient Education  2020 [employer].

## 2019-07-17 NOTE — Addendum Note (Signed)
Addended byCarrolyn Leigh on: 07/17/2019 02:27 PM   Modules accepted: Orders

## 2019-07-17 NOTE — Progress Notes (Signed)
Subjective: CC: f/u HLD, tobacco use PCP: Janora Norlander, DO [id number] Hild is a 68 y.o. female presenting to clinic today for:  1.  Hyperlipidemia/tobacco use disorder Patient was compliant with the Lipitor 40 mg daily for several months before discontinuing.  She has been placed on several medications after her last total knee replacement on the left in 2019 and felt overwhelmed by the amount of medicine she was on and so discontinued it.  She continues to smoke just under half a pack per day and has been a smoker for several years.  She is contemplating on stopping smoking and would be interested in Chantix.  However, would probably wait until after the new year for this because she is having a change of insurance for the better.  Denies any chest pain, shortness of breath, lower extremity edema, falls.  No difficulty swallowing, change in voice, night sweats, hemoptysis or unplanned weight loss.  2.  Left shoulder Patient has had some increased catching in the left shoulder.  She reports history of surgical intervention for fragments of bone that were within the shoulder.  She had this done with Dr. Latanya Maudlin in [place] several years ago.  She is think about seeing him again but waiting for after the new year.  Her knee replacements were done at [facility] in [place].  ROS: Per HPI  Allergies  Allergen Reactions  . Black Walnut Pollen Allergy Skin Test Itching  . Citrullus Vulgaris Itching  . Other Itching   Past Medical History:  Diagnosis Date  . Arthritis   . Cancer ([facility]) 2017   melanoma  . Cigarette smoker   . History of meniscal tear   . Hyperlipidemia     Current Outpatient Medications:  .  acetaminophen (TYLENOL) 500 MG tablet, Take by mouth., Disp: , Rfl:  .  Biotin 1000 MCG CHEW, Chew 5,000 mg by mouth daily., Disp: , Rfl:  .  cetirizine (ZYRTEC) 10 MG tablet, Take 10 mg by mouth daily., Disp: , Rfl:  .  ibuprofen (ADVIL) 200 MG tablet, Use As Directed, Disp: ,  Rfl:  .  varenicline (CHANTIX CONTINUING MONTH PAK) 1 MG tablet, Take 1 tablet (1 mg total) by mouth 2 (two) times daily., Disp: 60 tablet, Rfl: 2 .  varenicline (CHANTIX STARTING MONTH PAK) 0.5 MG X 11 & 1 MG X 42 tablet, Take 0.5 mg tablet by mouth once daily x3 days, then 0.5 mg tablet twice daily x4 days, then increase to one 1 mg tablet twice daily., Disp: 53 tablet, Rfl: 0 Social History   Socioeconomic History  . Marital status: Married    Spouse name: Not on file  . Number of children: 0  . Years of education: Not on file  . Highest education level: Not on file  Occupational History  . Not on file  Tobacco Use  . Smoking status: Current Every Day Smoker    Packs/day: 0.25    Years: 15.00    Pack years: 3.75    Types: Cigarettes  . Smokeless tobacco: Never Used  . Tobacco comment: Two cigarettes daily. 28 pack year history (until 2019)  Substance and Sexual Activity  . Alcohol use: Not Currently    Comment: Several times yearly.  . Drug use: Never  . Sexual activity: Yes    Birth control/protection: Post-menopausal  Other Topics Concern  . Not on file  Social History Narrative  . Not on file   Social Determinants of Health   Financial Resource Strain:   .  Difficulty of Paying Living Expenses: Not on file  Food Insecurity:   . Worried About Charity fundraiser in the Last Year: Not on file  . Ran Out of Food in the Last Year: Not on file  Transportation Needs:   . Lack of Transportation (Medical): Not on file  . Lack of Transportation (Non-Medical): Not on file  Physical Activity:   . Days of Exercise per Week: Not on file  . Minutes of Exercise per Session: Not on file  Stress:   . Feeling of Stress : Not on file  Social Connections:   . Frequency of Communication with Friends and Family: Not on file  . Frequency of Social Gatherings with Friends and Family: Not on file  . Attends Religious Services: Not on file  . Active Member of Clubs or Organizations: Not  on file  . Attends Archivist Meetings: Not on file  . Marital Status: Not on file  Intimate Partner Violence:   . Fear of Current or Ex-Partner: Not on file  . Emotionally Abused: Not on file  . Physically Abused: Not on file  . Sexually Abused: Not on file   Family History  Problem Relation Age of Onset  . Arthritis Mother   . Stroke Mother 46  . Hearing loss Mother   . Heart attack Father        as a result of anesthesia/ ankle surgery  . Cirrhosis Brother   . Alcohol abuse Brother     Objective: Office vital signs reviewed. BP 138/72   Pulse 85   Temp 98 F (36.7 C) (Temporal)   Ht 5\' 6"  (1.676 m)   Wt 153 lb (69.4 kg)   SpO2 97%   BMI 24.69 kg/m   Physical Examination:  General: Awake, alert, well nourished, No acute distress HEENT: Normal; no lymphadenopathy.  Oropharyngeal exam without evidence of masses or other concerning findings. Cardio: regular rate and rhythm, S1S2 heard, no murmurs appreciated Pulm: Globally decreased breath sounds but no wheezes.  Normal work of breathing on room air.  Prolonged expiratory phase. GI: soft, non-tender, non-distended, bowel sounds present x4, no hepatomegaly, no splenomegaly, no masses Extremities: warm, well perfused, No edema, cyanosis or clubbing; +2 pulses bilaterally MSK: normal gait and station  Left shoulder: Decreased active range of motion secondary to pain.  Assessment/ Plan: 68 y.o. female   1. Mixed hyperlipidemia Has been off statin.  She is nonfasting this morning.  Check lipid panel - lipid - CMP  2. Tobacco abuse In the contemplative stage of smoking cessation.  A written prescription provided for Chantix. - varenicline (CHANTIX STARTING MONTH PAK) 0.5 MG X 11 & 1 MG X 42 tablet; Take 0.5 mg tablet by mouth once daily x3 days, then 0.5 mg tablet twice daily x4 days, then increase to one 1 mg tablet twice daily.  Dispense: 53 tablet; Refill: 0 - varenicline (CHANTIX CONTINUING MONTH PAK) 1 MG  tablet; Take 1 tablet (1 mg total) by mouth 2 (two) times daily.  Dispense: 60 tablet; Refill: 2  3. History of total bilateral knee replacement Stable and doing well  4. Chronic left shoulder pain We will likely need eval by her orthopedist, Dr Latanya Maudlin, in [place] but will contact me when she is ready for this referral  5. History of shoulder surgery   No orders of the defined types were placed in this encounter.  Meds ordered this encounter  Medications  . varenicline (CHANTIX STARTING MONTH PAK) 0.5  MG X 11 & 1 MG X 42 tablet    Sig: Take 0.5 mg tablet by mouth once daily x3 days, then 0.5 mg tablet twice daily x4 days, then increase to one 1 mg tablet twice daily.    Dispense:  53 tablet    Refill:  0  . varenicline (CHANTIX CONTINUING MONTH PAK) 1 MG tablet    Sig: Take 1 tablet (1 mg total) by mouth 2 (two) times daily.    Dispense:  60 tablet    Refill:  [place], [place] [phone]

## 2019-07-18 LAB — CMP14+EGFR
ALT: 12 IU/L (ref 0–32)
AST: 14 IU/L (ref 0–40)
Albumin/Globulin Ratio: 1.8 (ref 1.2–2.2)
Albumin: 4.4 g/dL (ref 3.8–4.8)
Alkaline Phosphatase: 81 IU/L (ref 39–117)
BUN/Creatinine Ratio: 18 (ref 12–28)
BUN: 14 mg/dL (ref 8–27)
Bilirubin Total: 0.3 mg/dL (ref 0.0–1.2)
CO2: 24 mmol/L (ref 20–29)
Calcium: 8.7 mg/dL (ref 8.7–10.3)
Chloride: 104 mmol/L (ref 96–106)
Creatinine, Ser: 0.79 mg/dL (ref 0.57–1.00)
GFR calc Af Amer: 88 mL/min/{1.73_m2} (ref >59)
GFR calc non Af Amer: 77 mL/min/{1.73_m2} (ref >59)
Globulin, Total: 2.4 g/dL (ref 1.5–4.5)
Glucose: 93 mg/dL (ref 65–99)
Potassium: 4.2 mmol/L (ref 3.5–5.2)
Sodium: 141 mmol/L (ref 134–144)
Total Protein: 6.8 g/dL (ref 6.0–8.5)

## 2019-07-18 LAB — LIPID PANEL
Chol/HDL Ratio: 3.7 ratio (ref 0.0–4.4)
Cholesterol, Total: 200 mg/dL — ABNORMAL HIGH (ref 100–199)
HDL: 54 mg/dL (ref >39)
LDL Chol Calc (NIH): 127 mg/dL — ABNORMAL HIGH (ref 0–99)
Triglycerides: 107 mg/dL (ref 0–149)
VLDL Cholesterol Cal: 19 mg/dL (ref 5–40)

## 2019-08-25 DIAGNOSIS — D225 Melanocytic nevi of trunk: Secondary | ICD-10-CM | POA: Diagnosis not present

## 2019-08-25 DIAGNOSIS — Z85820 Personal history of malignant melanoma of skin: Secondary | ICD-10-CM | POA: Diagnosis not present

## 2019-08-25 DIAGNOSIS — D0372 Melanoma in situ of left lower limb, including hip: Secondary | ICD-10-CM | POA: Diagnosis not present

## 2019-08-25 DIAGNOSIS — L905 Scar conditions and fibrosis of skin: Secondary | ICD-10-CM | POA: Diagnosis not present

## 2019-08-25 DIAGNOSIS — L821 Other seborrheic keratosis: Secondary | ICD-10-CM | POA: Diagnosis not present

## 2020-02-23 DIAGNOSIS — Z85820 Personal history of malignant melanoma of skin: Secondary | ICD-10-CM | POA: Diagnosis not present

## 2020-02-23 DIAGNOSIS — L905 Scar conditions and fibrosis of skin: Secondary | ICD-10-CM | POA: Diagnosis not present

## 2020-02-23 DIAGNOSIS — D2239 Melanocytic nevi of other parts of face: Secondary | ICD-10-CM | POA: Diagnosis not present

## 2020-02-23 DIAGNOSIS — L821 Other seborrheic keratosis: Secondary | ICD-10-CM | POA: Diagnosis not present

## 2020-07-27 DIAGNOSIS — Z20822 Contact with and (suspected) exposure to covid-19: Secondary | ICD-10-CM | POA: Diagnosis not present

## 2020-07-27 DIAGNOSIS — M791 Myalgia, unspecified site: Secondary | ICD-10-CM | POA: Diagnosis not present

## 2020-08-23 DIAGNOSIS — L905 Scar conditions and fibrosis of skin: Secondary | ICD-10-CM | POA: Diagnosis not present

## 2020-08-23 DIAGNOSIS — L821 Other seborrheic keratosis: Secondary | ICD-10-CM | POA: Diagnosis not present

## 2020-08-23 DIAGNOSIS — L648 Other androgenic alopecia: Secondary | ICD-10-CM | POA: Diagnosis not present

## 2020-08-23 DIAGNOSIS — Z85820 Personal history of malignant melanoma of skin: Secondary | ICD-10-CM | POA: Diagnosis not present

## 2020-09-15 ENCOUNTER — Encounter: Payer: Self-pay | Admitting: Family Medicine

## 2020-09-21 ENCOUNTER — Ambulatory Visit (INDEPENDENT_AMBULATORY_CARE_PROVIDER_SITE_OTHER): Payer: Medicare Other

## 2020-09-21 ENCOUNTER — Ambulatory Visit (INDEPENDENT_AMBULATORY_CARE_PROVIDER_SITE_OTHER): Payer: Medicare Other | Admitting: Orthopaedic Surgery

## 2020-09-21 ENCOUNTER — Other Ambulatory Visit: Payer: Self-pay

## 2020-09-21 ENCOUNTER — Encounter: Payer: Self-pay | Admitting: Orthopaedic Surgery

## 2020-09-21 VITALS — BP 134/81 | HR 80 | Ht 66.0 in | Wt 145.0 lb

## 2020-09-21 DIAGNOSIS — G8929 Other chronic pain: Secondary | ICD-10-CM

## 2020-09-21 DIAGNOSIS — M25512 Pain in left shoulder: Secondary | ICD-10-CM | POA: Diagnosis not present

## 2020-09-21 DIAGNOSIS — M19012 Primary osteoarthritis, left shoulder: Secondary | ICD-10-CM

## 2020-09-21 NOTE — Progress Notes (Signed)
Office Visit Note   Patient: Cheryl Gonzalez           Date of Birth: 14-Nov-1950           MRN: [id number] Visit Date: 09/21/2020              Requested by: Cheryl Ip, DO [address] [place],  [place] [address] PCP: Cheryl Ip, DO   Assessment & Plan: Visit Diagnoses:  1. Chronic left shoulder pain   2. Primary osteoarthritis, left shoulder     Plan: Cheryl Gonzalez appears to have advanced primary osteoarthritis of her left shoulder.  Long discussion regarding x-ray findings and treatment options.  All questions were answered.  At this point she wants to continue with her IcyHot and over-the-counter medicines.  I have offered her therapy, cortisone injections and even discussed shoulder replacement.  There is always a possibility of rotator cuff pathology.  Before considering any surgery think she should have a thin slice CT scan of her shoulder.  She will let me know.  Follow-Up Instructions: Return if symptoms worsen or fail to improve.   Orders:  Orders Placed This Encounter  Procedures  . XR Shoulder Left   No orders of the defined types were placed in this encounter.     Procedures: No procedures performed   Clinical Data: No additional findings.   Subjective: Chief Complaint  Patient presents with  . Left Shoulder - Pain  Patient presents today for left shoulder pain. She states that she has decreased range of motion. Her pain is a level 6 typically, but depending on what she is doing that can change. She has a history of surgery on that shoulder with Dr.Daldorf in 2017. She has used over the counter pain medicine and that helps some.   HPI  Review of Systems   Objective: Vital Signs: BP 134/81   Pulse 80   Ht 5\' 6"  (1.676 m)   Wt 145 lb (65.8 kg)   BMI 23.40 kg/m   Physical Exam Constitutional:      Appearance: She is well-developed and well-nourished.  HENT:     Mouth/Throat:     Mouth: Oropharynx is clear and moist.  Eyes:      Extraocular Movements: EOM normal.     Pupils: Pupils are equal, round, and reactive to light.  Pulmonary:     Effort: Pulmonary effort is normal.  Skin:    General: Skin is warm and dry.  Neurological:     Mental Status: She is alert and oriented to person, place, and time.  Psychiatric:        Mood and Affect: Mood and affect normal.        Behavior: Behavior normal.     Ortho Exam shoulder with full overhead passive motion.  Is difficult for her to do that actively.  She can actively abduct 90 degrees but not much further.  There is about 15 degrees of external rotation on the left and probably 45 degrees on the right.  There is about 70 degrees of internal rotation a bit more on the right.  Good strength.  Good grip.  Biceps intact.  Reputation.  Specialty Comments:  No specialty comments available.  Imaging: XR Shoulder Left  Result Date: 09/21/2020 Films of the left shoulder obtained in several projections.  There appears to be primary osteoarthritis with flattening of the humeral head, large inferior humeral head spur and multiple subchondral cysts and sclerosis.  Normal space between  the humeral head and the acromium.  Significant narrowing of the glenohumeral joint on the lateral film.  No acute changes.    PMFS History: Patient Active Problem List   Diagnosis Date Noted  . Primary osteoarthritis, left shoulder 09/21/2020  . Tobacco abuse 11/04/2017  . Primary osteoarthritis of left knee 10/29/2017  . Allergic rhinitis 03/28/2005  . Sensorineural hearing loss 03/28/2005   Past Medical History:  Diagnosis Date  . Arthritis   . Cancer ([facility]) 2017   melanoma  . Cigarette smoker   . History of meniscal tear   . Hyperlipidemia     Family History  Problem Relation Age of Onset  . Arthritis Mother   . Stroke Mother 50  . Hearing loss Mother   . Heart attack Father        as a result of anesthesia/ ankle surgery  . Cirrhosis Brother   . Alcohol abuse Brother      Past Surgical History:  Procedure Laterality Date  . JOINT REPLACEMENT    . KNEE SURGERY Right   . SHOULDER SURGERY Left    Social History   Occupational History  . Not on file  Tobacco Use  . Smoking status: Current Every Day Smoker    Packs/day: 0.25    Years: 15.00    Pack years: 3.75    Types: Cigarettes  . Smokeless tobacco: Never Used  . Tobacco comment: Two cigarettes daily. 28 pack year history (until 2019)  Vaping Use  . Vaping Use: Never used  Substance and Sexual Activity  . Alcohol use: Not Currently    Comment: Several times yearly.  . Drug use: Never  . Sexual activity: Yes    Birth control/protection: Post-menopausal

## 2020-09-29 DIAGNOSIS — H40013 Open angle with borderline findings, low risk, bilateral: Secondary | ICD-10-CM | POA: Diagnosis not present

## 2020-11-29 DIAGNOSIS — H40013 Open angle with borderline findings, low risk, bilateral: Secondary | ICD-10-CM | POA: Diagnosis not present

## 2021-02-21 DIAGNOSIS — L905 Scar conditions and fibrosis of skin: Secondary | ICD-10-CM | POA: Diagnosis not present

## 2021-02-21 DIAGNOSIS — L648 Other androgenic alopecia: Secondary | ICD-10-CM | POA: Diagnosis not present

## 2021-02-21 DIAGNOSIS — L720 Epidermal cyst: Secondary | ICD-10-CM | POA: Diagnosis not present

## 2021-02-21 DIAGNOSIS — Z85820 Personal history of malignant melanoma of skin: Secondary | ICD-10-CM | POA: Diagnosis not present

## 2021-02-21 DIAGNOSIS — L821 Other seborrheic keratosis: Secondary | ICD-10-CM | POA: Diagnosis not present

## 2021-06-28 ENCOUNTER — Encounter: Payer: Self-pay | Admitting: Orthopaedic Surgery

## 2021-06-28 ENCOUNTER — Ambulatory Visit (INDEPENDENT_AMBULATORY_CARE_PROVIDER_SITE_OTHER): Payer: Medicare Other | Admitting: Orthopaedic Surgery

## 2021-06-28 ENCOUNTER — Other Ambulatory Visit: Payer: Self-pay

## 2021-06-28 VITALS — Ht 67.0 in | Wt 148.0 lb

## 2021-06-28 DIAGNOSIS — R202 Paresthesia of skin: Secondary | ICD-10-CM | POA: Diagnosis not present

## 2021-06-28 DIAGNOSIS — R200 Anesthesia of skin: Secondary | ICD-10-CM

## 2021-06-28 DIAGNOSIS — G5603 Carpal tunnel syndrome, bilateral upper limbs: Secondary | ICD-10-CM

## 2021-06-28 DIAGNOSIS — M19012 Primary osteoarthritis, left shoulder: Secondary | ICD-10-CM | POA: Diagnosis not present

## 2021-06-28 MED ORDER — BUPIVACAINE HCL 0.25 % IJ SOLN
2.0000 mL | INTRAMUSCULAR | Status: AC | PRN
  Administered 2021-06-28: 2 mL via INTRA_ARTICULAR

## 2021-06-28 MED ORDER — LIDOCAINE HCL 1 % IJ SOLN
2.0000 mL | INTRAMUSCULAR | Status: AC | PRN
  Administered 2021-06-28: 2 mL

## 2021-06-28 MED ORDER — METHYLPREDNISOLONE ACETATE 40 MG/ML IJ SUSP
80.0000 mg | INTRAMUSCULAR | Status: AC | PRN
  Administered 2021-06-28: 80 mg via INTRA_ARTICULAR

## 2021-06-28 NOTE — Progress Notes (Signed)
Office Visit Note   Patient: Cheryl Gonzalez           Date of Birth: May 05, 1951           MRN: [id number] Visit Date: 06/28/2021              Requested by: Raliegh Ip, DO [address] [place],  [place] [address] PCP: Raliegh Ip, DO   Assessment & Plan: Visit Diagnoses:  1. Numbness and tingling of upper extremity   2. Primary osteoarthritis, left shoulder   3. Carpal tunnel syndrome, bilateral     Plan: Cheryl Gonzalez is accompanied by her husband and seen for reevaluation of the osteoarthritis left shoulder.  Prior x-rays were consistent with primary osteoarthritis.  She notes that she is now "ready for an injection".  She does have limited range of motion and some crepitation.  I injected the shoulder with betamethasone, Marcaine and Xylocaine and have also discussed shoulder replacement surgery with her as well.  She will need a thin slice CT before she makes that decision.  She will let me know based on the results of the injection.  She also has been experiencing numbness and tingling in both upper extremities associated with dropping objects and having trouble sleeping at night.  She has been diagnosed with carpal tunnel in the past and is even worn splints.  Presently she is at the point of compromise.  I will order EMGs and nerve conduction studies.  I have discussed the carpal tunnel surgery with her and should she decide on that option.  We will discuss in more detail when she returns after the above studies  Follow-Up Instructions: Return After EMG and nerve conduction studies of both upper extremities.   Orders:  Orders Placed This Encounter  Procedures   Ambulatory referral to Physical Medicine Rehab   No orders of the defined types were placed in this encounter.     Procedures: Large Joint Inj: L glenohumeral on 06/28/2021 9:29 AM Details: 25 G 1.5 in needle, posterior approach  Arthrogram: No  Medications: 2 mL lidocaine 1 %; 80 mg methylPREDNISolone  acetate 40 MG/ML; 2 mL bupivacaine 0.25 % Procedure, treatment alternatives, risks and benefits explained, specific risks discussed. Consent was given by the patient. Immediately prior to procedure a time out was called to verify the correct patient, procedure, equipment, support staff and site/side marked as required. Patient was prepped and draped in the usual sterile fashion.      Clinical Data: No additional findings.   Subjective: Chief Complaint  Patient presents with   Left Shoulder - Pain   Right Hand - Pain   Left Hand - Pain  Patient presents with two issues. She is requesting cortisone injection in her left shoulder today as it continues to bother her.  She also has complaints with numbness and tingling in both hands, right=left. She states that this is a chronic condition but has now started to wake her at night. She occasionally has pain that radiates to the elbow. She is taking ibuprofen and tylenol arthritis with some relief.  HPI  Review of Systems   Objective: Vital Signs: Ht 5\' 7"  (1.702 m)   Wt 148 lb (67.1 kg)   BMI 23.18 kg/m   Physical Exam Constitutional:      Appearance: She is well-developed.  Pulmonary:     Effort: Pulmonary effort is normal.  Skin:    General: Skin is warm and dry.  Neurological:  Mental Status: She is alert and oriented to person, place, and time.  Psychiatric:        Behavior: Behavior normal.    Ortho Exam awake alert and oriented x3.  Comfortable sitting.  Left shoulder exam with limited range of motion.  Only about 15 to degrees of external rotation with pain.  There was some crepitation.  Able to flex about 100 degrees and abduct about 80 degrees.  Multiple arthritic changes in both of her hands including the MP PIP DIP joints and the base of the thumb.  Able to make a full fist but slowly.  Has positive Phalen's and Tinel's across both wrists consistent with carpal tunnel.  Some atrophy of the thenar eminence bilaterally  but able to oppose thumb to little finger  Specialty Comments:  No specialty comments available.  Imaging: No results found.   PMFS History: Patient Active Problem List   Diagnosis Date Noted   Carpal tunnel syndrome, bilateral 06/28/2021   Primary osteoarthritis, left shoulder 09/21/2020   Tobacco abuse 11/04/2017   Primary osteoarthritis of left knee 10/29/2017   Allergic rhinitis 03/28/2005   Sensorineural hearing loss 03/28/2005   Past Medical History:  Diagnosis Date   Arthritis    Cancer ([facility]) 2017   melanoma   Cigarette smoker    History of meniscal tear    Hyperlipidemia     Family History  Problem Relation Age of Onset   Arthritis Mother    Stroke Mother 96   Hearing loss Mother    Heart attack Father        as a result of anesthesia/ ankle surgery   Cirrhosis Brother    Alcohol abuse Brother     Past Surgical History:  Procedure Laterality Date   JOINT REPLACEMENT     KNEE SURGERY Right    SHOULDER SURGERY Left    Social History   Occupational History   Not on file  Tobacco Use   Smoking status: Every Day    Packs/day: 0.25    Years: 15.00    Pack years: 3.75    Types: Cigarettes   Smokeless tobacco: Never   Tobacco comments:    Two cigarettes daily. 28 pack year history (until 2019)  Vaping Use   Vaping Use: Never used  Substance and Sexual Activity   Alcohol use: Not Currently    Comment: Several times yearly.   Drug use: Never   Sexual activity: Yes    Birth control/protection: Post-menopausal

## 2021-07-05 ENCOUNTER — Ambulatory Visit (INDEPENDENT_AMBULATORY_CARE_PROVIDER_SITE_OTHER): Payer: Medicare Other | Admitting: Physical Medicine and Rehabilitation

## 2021-07-05 ENCOUNTER — Other Ambulatory Visit: Payer: Self-pay

## 2021-07-05 ENCOUNTER — Encounter: Payer: Self-pay | Admitting: Physical Medicine and Rehabilitation

## 2021-07-05 DIAGNOSIS — R202 Paresthesia of skin: Secondary | ICD-10-CM

## 2021-07-05 NOTE — Progress Notes (Signed)
Pt state numbness, tingling and pain in both hands. Pt state she feels numbness in her middle and ring finger. Pt state the pain wakes her up at night. Pt state she takes over the counter pain meds, rubs and shakes her hand to help ease her pain. Pt state she is right handed.  Numeric Pain Rating Scale and Functional Assessment Average Pain 5   In the last MONTH (on 0-10 scale) has pain interfered with the following?  1. General activity like being  able to carry out your everyday physical activities such as walking, climbing stairs, carrying groceries, or moving a chair?  Rating(10)   -BT, -Dye Allergies.

## 2021-07-10 NOTE — Progress Notes (Signed)
Cheryl Gonzalez - 70 y.o. female MRN [id number]  Date of birth: 02-01-1951  Office Visit Note: Visit Date: 07/05/2021 PCP: Raliegh Ip, DO Referred by: Raliegh Ip, DO  Subjective: Chief Complaint  Patient presents with   Right Hand - Pain, Numbness, Tingling   Left Hand - Pain, Numbness, Tingling   HPI:  Cheryl Gonzalez is a 70 y.o. female who comes in today at the request of Dr. Norlene Gonzalez for electrodiagnostic study of the Bilateral upper extremities.  Patient is Right hand dominant.  She reports chronic worsening severe bilateral pain numbness and tingling in both hands particularly in the middle and ring fingers on both hands.  She does get nocturnal complaints with a positive flick sign at night.  She has been diagnosed as having carpal tunnel syndrome and has worn braces but has not had electrodiagnostic studies.  She has tried medication management without any relief.  She denies any frank radicular symptoms but does have a lot of shoulder pain from arthritis.  ROS Otherwise per HPI.  Assessment & Plan: Visit Diagnoses:    ICD-10-CM   1. Paresthesia of skin  R20.2 NCV with EMG (electromyography)      Plan: Impression: The above electrodiagnostic study is ABNORMAL and reveals evidence of a severe bilateral median nerve entrapment at the wrist (carpal tunnel syndrome) affecting sensory and motor components.   There is no significant electrodiagnostic evidence of any other focal nerve entrapment, brachial plexopathy or cervical radiculopathy in either upper limb.   Recommendations: 1.  Follow-up with referring physician. 2.  Continue current management of symptoms. 3.  Suggest surgical evaluation.  Meds & Orders: No orders of the defined types were placed in this encounter.   Orders Placed This Encounter  Procedures   NCV with EMG (electromyography)    Follow-up: Return in about 2 weeks (around 07/19/2021) for Cheryl Campbell, MD.   Procedures: No  procedures performed  EMG & NCV Findings: Evaluation of the left median motor and the right median motor nerves showed prolonged distal onset latency (L6.3, R6.5 ms), reduced amplitude (L2.2, R1.7 mV), and decreased conduction velocity (Elbow-Wrist, L47, R45 m/s).  The left median (across palm) sensory nerve showed no response (Palm), prolonged distal peak latency (5.8 ms), and reduced amplitude (7.7 V).  The right median (across palm) sensory nerve showed prolonged distal peak latency (Wrist, 5.6 ms) and prolonged distal peak latency (Palm, 4.4 ms).  All remaining nerves (as indicated in the following tables) were within normal limits.  All left vs. right side differences were within normal limits.    All examined muscles (as indicated in the following table) showed no evidence of electrical instability.    Impression: The above electrodiagnostic study is ABNORMAL and reveals evidence of a severe bilateral median nerve entrapment at the wrist (carpal tunnel syndrome) affecting sensory and motor components.   There is no significant electrodiagnostic evidence of any other focal nerve entrapment, brachial plexopathy or cervical radiculopathy in either upper limb.   Recommendations: 1.  Follow-up with referring physician. 2.  Continue current management of symptoms. 3.  Suggest surgical evaluation.  ___________________________ Naaman Plummer FAAPMR Board Certified, American Board of Physical Medicine and Rehabilitation    Nerve Conduction Studies Anti Sensory Summary Table   Stim Site NR Peak (ms) Norm Peak (ms) P-T Amp (V) Norm P-T Amp Site1 Site2 Delta-P (ms) Dist (cm) Vel (m/s) Norm Vel (m/s)  Left Median Acr Palm Anti Sensory (2nd Digit)  31C  Wrist    *  5.8 <3.6 *7.7 >10 Wrist Palm  0.0    Palm *NR  <2.0          Right Median Acr Palm Anti Sensory (2nd Digit)  32.4C  Wrist    *5.6 <3.6 13.2 >10 Wrist Palm 1.2 0.0    Palm    *4.4 <2.0 4.8         Right Radial Anti Sensory (Base 1st  Digit)  32.6C  Wrist    2.4 <3.1 20.4  Wrist Base 1st Digit 2.4 0.0    Right Ulnar Anti Sensory (5th Digit)  32.9C  Wrist    3.5 <3.7 24.1 >15.0 Wrist 5th Digit 3.5 14.0 40 >38   Motor Summary Table   Stim Site NR Onset (ms) Norm Onset (ms) O-P Amp (mV) Norm O-P Amp Site1 Site2 Delta-0 (ms) Dist (cm) Vel (m/s) Norm Vel (m/s)  Left Median Motor (Abd Poll Brev)  31.7C  Wrist    *6.3 <4.2 *2.2 >5 Elbow Wrist 4.5 21.0 *47 >50  Elbow    10.8  2.1         Right Median Motor (Abd Poll Brev)  32.8C  Wrist    *6.5 <4.2 *1.7 >5 Elbow Wrist 4.7 21.0 *45 >50  Elbow    11.2  1.7         Right Ulnar Motor (Abd Dig Min)  32.9C  Wrist    3.2 <4.2 8.2 >3 B Elbow Wrist 3.4 19.5 57 >53  B Elbow    6.6  7.7  A Elbow B Elbow 1.8 10.0 56 >53  A Elbow    8.4  7.0          EMG   Side Muscle Nerve Root Ins Act Fibs Psw Amp Dur Poly Recrt Int Dennie Bible Comment  Left Abd Poll Brev Median C8-T1 Nml Nml Nml Nml Nml 0 Nml Nml   Left 1stDorInt Ulnar C8-T1 Nml Nml Nml Nml Nml 0 Nml Nml   Left PronatorTeres Median C6-7 Nml Nml Nml Nml Nml 0 Nml Nml   Left Biceps Musculocut C5-6 Nml Nml Nml Nml Nml 0 Nml Nml   Left Deltoid Axillary C5-6 Nml Nml Nml Nml Nml 0 Nml Nml     Nerve Conduction Studies Anti Sensory Left/Right Comparison   Stim Site L Lat (ms) R Lat (ms) L-R Lat (ms) L Amp (V) R Amp (V) L-R Amp (%) Site1 Site2 L Vel (m/s) R Vel (m/s) L-R Vel (m/s)  Median Acr Palm Anti Sensory (2nd Digit)  31C  Wrist *5.8 *5.6 0.2 *7.7 13.2 41.7 Wrist Palm     Palm  *4.4   4.8        Radial Anti Sensory (Base 1st Digit)  32.6C  Wrist  2.4   20.4  Wrist Base 1st Digit     Ulnar Anti Sensory (5th Digit)  32.9C  Wrist  3.5   24.1  Wrist 5th Digit  40    Motor Left/Right Comparison   Stim Site L Lat (ms) R Lat (ms) L-R Lat (ms) L Amp (mV) R Amp (mV) L-R Amp (%) Site1 Site2 L Vel (m/s) R Vel (m/s) L-R Vel (m/s)  Median Motor (Abd Poll Brev)  31.7C  Wrist *6.3 *6.5 0.2 *2.2 *1.7 22.7 Elbow Wrist *47 *45 2  Elbow  10.8 11.2 0.4 2.1 1.7 19.0       Ulnar Motor (Abd Dig Min)  32.9C  Wrist  3.2   8.2  B Elbow Wrist  57   B  Elbow  6.6   7.7  A Elbow B Elbow  56   A Elbow  8.4   7.0           Waveforms:                Clinical History: No specialty comments available.     Objective:  VS:  HT:    WT:   BMI:     BP:   HR: bpm  TEMP: ( )  RESP:  Physical Exam Musculoskeletal:        General: No swelling, tenderness or deformity.     Comments: Inspection reveals no atrophy of the bilateral APB or FDI or hand intrinsics. There is no swelling, color changes, allodynia or dystrophic changes. There is 5 out of 5 strength in the bilateral wrist extension, finger abduction and long finger flexion. There is intact sensation to light touch in all dermatomal and peripheral nerve distributions. There is a negative Phalen's test bilaterally. There is a negative Hoffmann's test bilaterally.  Skin:    General: Skin is warm and dry.     Findings: No erythema or rash.  Neurological:     General: No focal deficit present.     Mental Status: She is alert and oriented to person, place, and time.     Motor: No weakness or abnormal muscle tone.     Coordination: Coordination normal.  Psychiatric:        Mood and Affect: Mood normal.        Behavior: Behavior normal.     Imaging: No results found.

## 2021-07-11 NOTE — Procedures (Signed)
EMG & NCV Findings: Evaluation of the left median motor and the right median motor nerves showed prolonged distal onset latency (L6.3, R6.5 ms), reduced amplitude (L2.2, R1.7 mV), and decreased conduction velocity (Elbow-Wrist, L47, R45 m/s).  The left median (across palm) sensory nerve showed no response (Palm), prolonged distal peak latency (5.8 ms), and reduced amplitude (7.7 V).  The right median (across palm) sensory nerve showed prolonged distal peak latency (Wrist, 5.6 ms) and prolonged distal peak latency (Palm, 4.4 ms).  All remaining nerves (as indicated in the following tables) were within normal limits.  All left vs. right side differences were within normal limits.    All examined muscles (as indicated in the following table) showed no evidence of electrical instability.    Impression: The above electrodiagnostic study is ABNORMAL and reveals evidence of a severe bilateral median nerve entrapment at the wrist (carpal tunnel syndrome) affecting sensory and motor components.   There is no significant electrodiagnostic evidence of any other focal nerve entrapment, brachial plexopathy or cervical radiculopathy in either upper limb.   Recommendations: 1.  Follow-up with referring physician. 2.  Continue current management of symptoms. 3.  Suggest surgical evaluation.  ___________________________ Laurence Spates FAAPMR Board Certified, American Board of Physical Medicine and Rehabilitation    Nerve Conduction Studies Anti Sensory Summary Table   Stim Site NR Peak (ms) Norm Peak (ms) P-T Amp (V) Norm P-T Amp Site1 Site2 Delta-P (ms) Dist (cm) Vel (m/s) Norm Vel (m/s)  Left Median Acr Palm Anti Sensory (2nd Digit)  31C  Wrist    *5.8 <3.6 *7.7 >10 Wrist Palm  0.0    Palm *NR  <2.0          Right Median Acr Palm Anti Sensory (2nd Digit)  32.4C  Wrist    *5.6 <3.6 13.2 >10 Wrist Palm 1.2 0.0    Palm    *4.4 <2.0 4.8         Right Radial Anti Sensory (Base 1st Digit)  32.6C  Wrist     2.4 <3.1 20.4  Wrist Base 1st Digit 2.4 0.0    Right Ulnar Anti Sensory (5th Digit)  32.9C  Wrist    3.5 <3.7 24.1 >15.0 Wrist 5th Digit 3.5 14.0 40 >38   Motor Summary Table   Stim Site NR Onset (ms) Norm Onset (ms) O-P Amp (mV) Norm O-P Amp Site1 Site2 Delta-0 (ms) Dist (cm) Vel (m/s) Norm Vel (m/s)  Left Median Motor (Abd Poll Brev)  31.7C  Wrist    *6.3 <4.2 *2.2 >5 Elbow Wrist 4.5 21.0 *47 >50  Elbow    10.8  2.1         Right Median Motor (Abd Poll Brev)  32.8C  Wrist    *6.5 <4.2 *1.7 >5 Elbow Wrist 4.7 21.0 *45 >50  Elbow    11.2  1.7         Right Ulnar Motor (Abd Dig Min)  32.9C  Wrist    3.2 <4.2 8.2 >3 B Elbow Wrist 3.4 19.5 57 >53  B Elbow    6.6  7.7  A Elbow B Elbow 1.8 10.0 56 >53  A Elbow    8.4  7.0          EMG   Side Muscle Nerve Root Ins Act Fibs Psw Amp Dur Poly Recrt Int Fraser Din Comment  Left Abd Poll Brev Median C8-T1 Nml Nml Nml Nml Nml 0 Nml Nml   Left 1stDorInt Ulnar C8-T1 Nml Nml Nml Nml  Nml 0 Nml Nml   Left PronatorTeres Median C6-7 Nml Nml Nml Nml Nml 0 Nml Nml   Left Biceps Musculocut C5-6 Nml Nml Nml Nml Nml 0 Nml Nml   Left Deltoid Axillary C5-6 Nml Nml Nml Nml Nml 0 Nml Nml     Nerve Conduction Studies Anti Sensory Left/Right Comparison   Stim Site L Lat (ms) R Lat (ms) L-R Lat (ms) L Amp (V) R Amp (V) L-R Amp (%) Site1 Site2 L Vel (m/s) R Vel (m/s) L-R Vel (m/s)  Median Acr Palm Anti Sensory (2nd Digit)  31C  Wrist *5.8 *5.6 0.2 *7.7 13.2 41.7 Wrist Palm     Palm  *4.4   4.8        Radial Anti Sensory (Base 1st Digit)  32.6C  Wrist  2.4   20.4  Wrist Base 1st Digit     Ulnar Anti Sensory (5th Digit)  32.9C  Wrist  3.5   24.1  Wrist 5th Digit  40    Motor Left/Right Comparison   Stim Site L Lat (ms) R Lat (ms) L-R Lat (ms) L Amp (mV) R Amp (mV) L-R Amp (%) Site1 Site2 L Vel (m/s) R Vel (m/s) L-R Vel (m/s)  Median Motor (Abd Poll Brev)  31.7C  Wrist *6.3 *6.5 0.2 *2.2 *1.7 22.7 Elbow Wrist *47 *45 2  Elbow 10.8 11.2 0.4 2.1 1.7  19.0       Ulnar Motor (Abd Dig Min)  32.9C  Wrist  3.2   8.2  B Elbow Wrist  57   B Elbow  6.6   7.7  A Elbow B Elbow  56   A Elbow  8.4   7.0           Waveforms:

## 2021-07-12 ENCOUNTER — Encounter: Payer: Self-pay | Admitting: Orthopaedic Surgery

## 2021-07-12 ENCOUNTER — Other Ambulatory Visit: Payer: Self-pay

## 2021-07-12 ENCOUNTER — Ambulatory Visit (INDEPENDENT_AMBULATORY_CARE_PROVIDER_SITE_OTHER): Payer: Medicare Other | Admitting: Orthopaedic Surgery

## 2021-07-12 DIAGNOSIS — R202 Paresthesia of skin: Secondary | ICD-10-CM | POA: Diagnosis not present

## 2021-07-12 DIAGNOSIS — R200 Anesthesia of skin: Secondary | ICD-10-CM | POA: Diagnosis not present

## 2021-07-12 NOTE — Progress Notes (Signed)
Office Visit Note   Patient: Cheryl Gonzalez           Date of Birth: 06-20-1951           MRN: [id number] Visit Date: 07/12/2021              Requested by: Raliegh Ip, DO [address] [place],  [place] [address] PCP: Raliegh Ip, DO  No chief complaint on file.     HPI: Patient is a pleasant 70 year old woman with a history of bilateral hand numbness and left shoulder arthritis.  At her last visit she had a left shoulder injection which did help her on the top part of her shoulder.  She still continues to have pain radiating around the inferior aspect of the shoulder into her axilla.  She cannot reach overhead.  She is fine doing things when the arm is down.  Also here to review bilateral electro conductive studies of her wrists.  Assessment & Plan: Visit Diagnoses: Left shoulder arthritis                            Bilateral carpal tunnel syndrome  Plan: It was reviewed with her that she has very fairly advanced arthritis of her left shoulder.  Certainly injections can be helpful but these are all temporary solutions.  At some point she would need to consider a left shoulder replacement he has recommended in the past that she would need a thin slice CT scan before she makes that decision.  EMG studies were reviewed which demonstrate severe carpal tunnel syndrome bilateral.  Discussed this result with her.  She would like to go forward with her nondominant left hand first.  Surgery was explained to her as well as the risks and recovery.  We will schedule this at her convenience.  Follow-Up Instructions: No follow-ups on file.   Ortho Exam  Patient is alert, oriented, no adenopathy, well-dressed, normal affect, normal respiratory effort. Left shoulder she has forward elevation to 100 degrees limited external rotation.  She has crepitus with palpation and range of motion. Bilateral hands she does have some thenar atrophy bilaterally she does have some decreased grip strength  she has sensation changes in her fingers  Imaging: No results found. No images are attached to the encounter.  Labs: No results found for: HGBA1C, ESRSEDRATE, CRP, LABURIC, REPTSTATUS, GRAMSTAIN, CULT, LABORGA   Lab Results  Component Value Date   ALBUMIN 4.4 07/17/2019   ALBUMIN 4.5 11/04/2017    No results found for: MG No results found for: VD25OH  No results found for: PREALBUMIN CBC EXTENDED Latest Ref Rng & Units 11/04/2017  WBC 3.4 - 10.8 x10E3/uL 6.4  RBC 3.77 - 5.28 x10E6/uL 4.62  HGB 11.1 - 15.9 g/dL 19.1  HCT 47.8 - 29.5 % 39.3  PLT 150 - 379 x10E3/uL 365  NEUTROABS 1.4 - 7.0 x10E3/uL 3.4  LYMPHSABS 0.7 - 3.1 x10E3/uL 2.1     There is no height or weight on file to calculate BMI.  Orders:  No orders of the defined types were placed in this encounter.  No orders of the defined types were placed in this encounter.    Procedures: No procedures performed  Clinical Data: No additional findings.  ROS:  All other systems negative, except as noted in the HPI. Review of Systems  Objective: Vital Signs: There were no vitals taken for this visit.  Specialty Comments:  No specialty comments  available.  PMFS History: Patient Active Problem List   Diagnosis Date Noted   Carpal tunnel syndrome, bilateral 06/28/2021   Primary osteoarthritis, left shoulder 09/21/2020   Tobacco abuse 11/04/2017   Primary osteoarthritis of left knee 10/29/2017   Allergic rhinitis 03/28/2005   Sensorineural hearing loss 03/28/2005   Past Medical History:  Diagnosis Date   Arthritis    Cancer (HCC) 2017   melanoma   Cigarette smoker    History of meniscal tear    Hyperlipidemia     Family History  Problem Relation Age of Onset   Arthritis Mother    Stroke Mother 22   Hearing loss Mother    Heart attack Father        as a result of anesthesia/ ankle surgery   Cirrhosis Brother    Alcohol abuse Brother     Past Surgical History:  Procedure Laterality Date    JOINT REPLACEMENT     KNEE SURGERY Right    SHOULDER SURGERY Left    Social History   Occupational History   Not on file  Tobacco Use   Smoking status: Every Day    Packs/day: 0.25    Years: 15.00    Pack years: 3.75    Types: Cigarettes   Smokeless tobacco: Never   Tobacco comments:    Two cigarettes daily. 28 pack year history (until 2019)  Vaping Use   Vaping Use: Never used  Substance and Sexual Activity   Alcohol use: Not Currently    Comment: Several times yearly.   Drug use: Never   Sexual activity: Yes    Birth control/protection: Post-menopausal

## 2021-08-24 ENCOUNTER — Other Ambulatory Visit: Payer: Self-pay | Admitting: Orthopaedic Surgery

## 2021-08-24 DIAGNOSIS — G5602 Carpal tunnel syndrome, left upper limb: Secondary | ICD-10-CM | POA: Diagnosis not present

## 2021-08-24 MED ORDER — HYDROCODONE-ACETAMINOPHEN 5-325 MG PO TABS: 1.0000 | ORAL_TABLET | ORAL | 0 refills | Status: AC | PRN

## 2021-08-31 ENCOUNTER — Other Ambulatory Visit: Payer: Self-pay

## 2021-08-31 ENCOUNTER — Ambulatory Visit (INDEPENDENT_AMBULATORY_CARE_PROVIDER_SITE_OTHER): Payer: Medicare Other | Admitting: Orthopaedic Surgery

## 2021-08-31 ENCOUNTER — Encounter: Payer: Self-pay | Admitting: Orthopaedic Surgery

## 2021-08-31 VITALS — Ht 67.0 in | Wt 148.0 lb

## 2021-08-31 DIAGNOSIS — G5603 Carpal tunnel syndrome, bilateral upper limbs: Secondary | ICD-10-CM

## 2021-08-31 NOTE — Progress Notes (Signed)
1 week post left carpal tunnel release by Dr. Durward Fortes.  Incision looks good ibuprofen is being used for pain.  Dressing changed return next week for likely suture removal with Dr. Durward Fortes.  She has some stiffness in her fingers and will work on some passive flexion and active assistive flexion with fingertips to avoid finger stiffness.  She is happy with the improvement in her pain since the surgery

## 2021-09-05 ENCOUNTER — Encounter: Payer: Medicare Other | Admitting: Orthopaedic Surgery

## 2021-09-06 ENCOUNTER — Other Ambulatory Visit: Payer: Self-pay

## 2021-09-06 ENCOUNTER — Encounter: Payer: Self-pay | Admitting: Orthopaedic Surgery

## 2021-09-06 ENCOUNTER — Ambulatory Visit (INDEPENDENT_AMBULATORY_CARE_PROVIDER_SITE_OTHER): Payer: Medicare Other | Admitting: Orthopaedic Surgery

## 2021-09-06 DIAGNOSIS — G5603 Carpal tunnel syndrome, bilateral upper limbs: Secondary | ICD-10-CM

## 2021-09-06 NOTE — Progress Notes (Signed)
Office Visit Note   Patient: Cheryl Gonzalez           Date of Birth: 07/09/1951           MRN: [id number] Visit Date: 09/06/2021              Requested by: Raliegh Ip, DO [address] [place],  [place] [address] PCP: Raliegh Ip, DO   Assessment & Plan: Visit Diagnoses:  1. Carpal tunnel syndrome, bilateral     Plan: 2-week status post left carpal tunnel release and doing well.  No longer has the pain.  Still little tingling in her fingers but "much better".  Wound is healing without problem.  Remove the stitches and applied Steri-Strips over benzoin.  When she is active I like her to wear the splint to protect the incision for at least 2 more weeks and then reevaluate in 2 weeks  Follow-Up Instructions: Return in about 2 weeks (around 09/20/2021).   Orders:  No orders of the defined types were placed in this encounter.  No orders of the defined types were placed in this encounter.     Procedures: No procedures performed   Clinical Data: No additional findings.   Subjective: Chief Complaint  Patient presents with   Left Wrist - Routine Post Op    Left carpal tunnel release 08/24/2021  Patient presents today for follow up on her left wrist. She is now 13 days out from left carpal tunnel release. Her surgery was on 08/24/2021. She states that she is doing well. She has been taking Ibuprofen as needed.   HPI  Review of Systems   Objective: Vital Signs: There were no vitals taken for this visit.  Physical Exam  Ortho Exam left carpal tunnel incision healing without problem.  I remove the stitches and applied Steri-Strips over benzoin.  Does have some arthritic changes in her hand but was able to make a full fist.  Good opposition of thumb to little finger.  Good capillary refill to the fingers  Specialty Comments:  No specialty comments available.  Imaging: No results found.   PMFS History: Patient Active Problem List   Diagnosis Date Noted    Carpal tunnel syndrome, bilateral 06/28/2021   Primary osteoarthritis, left shoulder 09/21/2020   Tobacco abuse 11/04/2017   Primary osteoarthritis of left knee 10/29/2017   Allergic rhinitis 03/28/2005   Sensorineural hearing loss 03/28/2005   Past Medical History:  Diagnosis Date   Arthritis    Cancer (HCC) 2017   melanoma   Cigarette smoker    History of meniscal tear    Hyperlipidemia     Family History  Problem Relation Age of Onset   Arthritis Mother    Stroke Mother 61   Hearing loss Mother    Heart attack Father        as a result of anesthesia/ ankle surgery   Cirrhosis Brother    Alcohol abuse Brother     Past Surgical History:  Procedure Laterality Date   JOINT REPLACEMENT     KNEE SURGERY Right    SHOULDER SURGERY Left    Social History   Occupational History   Not on file  Tobacco Use   Smoking status: Every Day    Packs/day: 0.25    Years: 15.00    Pack years: 3.75    Types: Cigarettes   Smokeless tobacco: Never   Tobacco comments:    Two cigarettes daily. 28 pack year history (until 2019)  Vaping Use   Vaping Use: Never used  Substance and Sexual Activity   Alcohol use: Not Currently    Comment: Several times yearly.   Drug use: Never   Sexual activity: Yes    Birth control/protection: Post-menopausal

## 2021-09-20 ENCOUNTER — Ambulatory Visit (INDEPENDENT_AMBULATORY_CARE_PROVIDER_SITE_OTHER): Payer: Medicare Other | Admitting: Orthopaedic Surgery

## 2021-09-20 ENCOUNTER — Encounter: Payer: Self-pay | Admitting: Orthopaedic Surgery

## 2021-09-20 ENCOUNTER — Other Ambulatory Visit: Payer: Self-pay

## 2021-09-20 DIAGNOSIS — M19012 Primary osteoarthritis, left shoulder: Secondary | ICD-10-CM

## 2021-09-20 DIAGNOSIS — G5603 Carpal tunnel syndrome, bilateral upper limbs: Secondary | ICD-10-CM

## 2021-09-20 MED ORDER — BUPIVACAINE HCL 0.25 % IJ SOLN
2.0000 mL | INTRAMUSCULAR | Status: AC | PRN
  Administered 2021-09-20: 2 mL via INTRA_ARTICULAR

## 2021-09-20 NOTE — Progress Notes (Signed)
Office Visit Note   Patient: Cheryl Gonzalez           Date of Birth: Apr 27, 1951           MRN: [id number] Visit Date: 09/20/2021              Requested by: Raliegh Ip, DO [address] [place],  [place] [address] PCP: Raliegh Ip, DO   Assessment & Plan: Visit Diagnoses:  1. Primary osteoarthritis, left shoulder   2. Carpal tunnel syndrome, bilateral     Plan: Cheryl Gonzalez is a month status post release of left carpal tunnel and doing well.  She still has a little tingling into the radial 3 digits fingertips but better than preoperatively.  She does not have the pain trouble sleeping at night.  The wound is healing nicely and she has full range of motion of her hand.  She certainly has some degenerative changes at the PIP and DIP joints which are symptomatic but from a postsurgical standpoint I think she is done well.  She would like to proceed at some point in the near future with release of the right carpal tunnel but she is not "ready yet".  She will let me know.  Also has been followed for the osteoarthritis of the left shoulder that appears to be probable primary osteoarthritis.  She wanted to have another cortisone injection today.  This was performed through an anterior approach.  She is fully aware of the definitive procedure of shoulder replacement.  She need a thin slice CT scan of her shoulder prior to any skin surgery being scheduled.  I have discussed to some extent to the the surgery and postoperative course.  She will also let us know when she is ready to proceed  Follow-Up Instructions: Return if symptoms worsen or fail to improve.   Orders:  Orders Placed This Encounter  Procedures   Large Joint Inj: L glenohumeral   No orders of the defined types were placed in this encounter.     Procedures: Large Joint Inj: L glenohumeral on 09/20/2021 10:06 AM Details: 25 G 1.5 in needle, posterior approach  Arthrogram: No  Medications: 2 mL bupivacaine 0.25 %  12  mg betamethasone injected with Marcaine into the left shoulder glenohumeral joint through an anterior approach Procedure, treatment alternatives, risks and benefits explained, specific risks discussed. Consent was given by the patient. Immediately prior to procedure a time out was called to verify the correct patient, procedure, equipment, support staff and site/side marked as required. Patient was prepped and draped in the usual sterile fashion.      Clinical Data: No additional findings.   Subjective: Chief Complaint  Patient presents with   Left Wrist - Follow-up    Carpal tunnel release 08/24/2021   Left Shoulder - Follow-up, Pain  Patient presents today for a follow up on her left wrist. She had carpal tunnel release on 08/24/2021. She states that she is getting better. She is still unable to lift anything and cannot rotate her wrist to turn a door knob.  She also wants to have her left shoulder injected with cortisone today. Her last injection was given on 06/28/2021.  HPI  Review of Systems   Objective: Vital Signs: There were no vitals taken for this visit.  Physical Exam Constitutional:      Appearance: She is well-developed.  Eyes:     Pupils: Pupils are equal, round, and reactive to light.  Pulmonary:  Effort: Pulmonary effort is normal.  Skin:    General: Skin is warm and dry.  Neurological:     Mental Status: She is alert and oriented to person, place, and time.  Psychiatric:        Behavior: Behavior normal.    Ortho Exam left carpal tunnel incision is healed nicely.  There is some superficial skin peeling on either side of the incision which was removed and the wound otherwise looks fine and nice and pink.  Still little bit of tenderness over the incision has full range of motion but degenerative changes at the DIP and PIP joints.  Good opposition of thumb the little finger.  There is some atrophy of the thenar musculature which is unchanged from her  preoperative status.  Good sensitivity of the tips of her fingers.  Limited range of motion of left shoulder but no change from prior evaluations related to the end-stage osteoarthritis good grip and release  Specialty Comments:  No specialty comments available.  Imaging: No results found.   PMFS History: Patient Active Problem List   Diagnosis Date Noted   Carpal tunnel syndrome, bilateral 06/28/2021   Primary osteoarthritis, left shoulder 09/21/2020   Tobacco abuse 11/04/2017   Primary osteoarthritis of left knee 10/29/2017   Allergic rhinitis 03/28/2005   Sensorineural hearing loss 03/28/2005   Past Medical History:  Diagnosis Date   Arthritis    Cancer (HCC) 2017   melanoma   Cigarette smoker    History of meniscal tear    Hyperlipidemia     Family History  Problem Relation Age of Onset   Arthritis Mother    Stroke Mother 33   Hearing loss Mother    Heart attack Father        as a result of anesthesia/ ankle surgery   Cirrhosis Brother    Alcohol abuse Brother     Past Surgical History:  Procedure Laterality Date   JOINT REPLACEMENT     KNEE SURGERY Right    SHOULDER SURGERY Left    Social History   Occupational History   Not on file  Tobacco Use   Smoking status: Every Day    Packs/day: 0.25    Years: 15.00    Pack years: 3.75    Types: Cigarettes   Smokeless tobacco: Never   Tobacco comments:    Two cigarettes daily. 28 pack year history (until 2019)  Vaping Use   Vaping Use: Never used  Substance and Sexual Activity   Alcohol use: Not Currently    Comment: Several times yearly.   Drug use: Never   Sexual activity: Yes    Birth control/protection: Post-menopausal

## 2021-10-03 DIAGNOSIS — H40013 Open angle with borderline findings, low risk, bilateral: Secondary | ICD-10-CM | POA: Diagnosis not present

## 2021-11-08 DIAGNOSIS — L821 Other seborrheic keratosis: Secondary | ICD-10-CM | POA: Diagnosis not present

## 2021-11-08 DIAGNOSIS — L905 Scar conditions and fibrosis of skin: Secondary | ICD-10-CM | POA: Diagnosis not present

## 2021-11-08 DIAGNOSIS — L648 Other androgenic alopecia: Secondary | ICD-10-CM | POA: Diagnosis not present

## 2021-11-08 DIAGNOSIS — Z85820 Personal history of malignant melanoma of skin: Secondary | ICD-10-CM | POA: Diagnosis not present

## 2022-03-11 DIAGNOSIS — B349 Viral infection, unspecified: Secondary | ICD-10-CM | POA: Diagnosis not present

## 2022-07-04 ENCOUNTER — Ambulatory Visit: Payer: Medicare Other | Admitting: Orthopaedic Surgery

## 2022-07-04 ENCOUNTER — Encounter: Payer: Self-pay | Admitting: Orthopaedic Surgery

## 2022-07-04 DIAGNOSIS — G5603 Carpal tunnel syndrome, bilateral upper limbs: Secondary | ICD-10-CM | POA: Diagnosis not present

## 2022-07-04 NOTE — Progress Notes (Signed)
Office Visit Note   Patient: Cheryl Gonzalez           Date of Birth: 09-11-1950           MRN: [id number] Visit Date: 07/04/2022              Requested by: Raliegh Ip, DO [address] [place],  [place] [address] PCP: Raliegh Ip, DO   Assessment & Plan: Visit Diagnoses:  1. Carpal tunnel syndrome, bilateral     Plan: Cheryl Gonzalez comes in today for a to discuss her carpal tunnel syndrome in her right wrist.  This was diagnosed earlier this year by EMGs.  She is status post left carpal tunnel release in March and is doing quite well.  She is now having increasing symptoms in her right wrist and it is awakening her at night.  She does live in [place].  We will refer her to Dr. Ophelia Charter for surgical planning for right carpal tunnel release.  She is in agreement with plan.  Prior EMGs nerve conduction studies performed by Dr. 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  There was no significant electrodiagnostic evidence of any other focal nerve entrapment, brachial plexopathy or cervical radiculopathy in either upper extremity  Follow-Up Instructions: Return With Dr. Ophelia Charter for surgical planning Right CTR.   Orders:  Orders Placed This Encounter  Procedures   Ambulatory referral to Orthopedic Surgery   No orders of the defined types were placed in this encounter.     Procedures: No procedures performed   Clinical Data: No additional findings.   Subjective: No chief complaint on file.   HPI Cheryl Gonzalez is a pleasant 71 year old woman who is status post left carpal tunnel release in March 2023.  At the time of her electrodiagnostic study she did have it on both wrists as she also has right carpal tunnel syndrome.  She is now ready to go forward with a carpal tunnel release on her right wrist.  She is having pain now that awakens her at night  Review of Systems  All other systems  reviewed and are negative.    Objective: Vital Signs: There were no vitals taken for this visit.  Physical Exam Constitutional:      Appearance: Normal appearance.  Pulmonary:     Effort: Pulmonary effort is normal.  Neurological:     General: No focal deficit present.     Mental Status: She is alert.     Ortho Exam Left wrist: Well-healed surgical incision no erythema no swelling she is neurovascularly intact.  She easily opposes all of her fingers to her thumb. Right wrist strong radial pulse no swelling.  She does have a positive Tinel's sign.  And paresthesias in the median nerve distribution she does have a warm hand with brisk capillary refill.  Multiple degenerative changes in both of her hands and bases of thumbs Specialty Comments:  No specialty comments available.  Imaging: No results found.   PMFS History: Patient Active Problem List   Diagnosis Date Noted   Carpal tunnel syndrome, bilateral 06/28/2021   Primary osteoarthritis, left shoulder 09/21/2020   Tobacco abuse 11/04/2017   Primary osteoarthritis of left knee 10/29/2017   Allergic rhinitis 03/28/2005   Sensorineural hearing loss 03/28/2005   Past Medical History:  Diagnosis Date   Arthritis    Cancer (HCC) 2017   melanoma   Cigarette smoker  History of meniscal tear    Hyperlipidemia     Family History  Problem Relation Age of Onset   Arthritis Mother    Stroke Mother 32   Hearing loss Mother    Heart attack Father        as a result of anesthesia/ ankle surgery   Cirrhosis Brother    Alcohol abuse Brother     Past Surgical History:  Procedure Laterality Date   JOINT REPLACEMENT     KNEE SURGERY Right    SHOULDER SURGERY Left    Social History   Occupational History   Not on file  Tobacco Use   Smoking status: Every Day    Packs/day: 0.25    Years: 15.00    Total pack years: 3.75    Types: Cigarettes   Smokeless tobacco: Never   Tobacco comments:    Two cigarettes daily.  28 pack year history (until 2019)  Vaping Use   Vaping Use: Never used  Substance and Sexual Activity   Alcohol use: Not Currently    Comment: Several times yearly.   Drug use: Never   Sexual activity: Yes    Birth control/protection: Post-menopausal

## 2022-07-05 ENCOUNTER — Ambulatory Visit: Payer: Medicare Other | Admitting: Orthopaedic Surgery

## 2022-07-05 VITALS — Ht 66.5 in | Wt 139.0 lb

## 2022-07-05 DIAGNOSIS — G5603 Carpal tunnel syndrome, bilateral upper limbs: Secondary | ICD-10-CM

## 2022-07-05 DIAGNOSIS — G5601 Carpal tunnel syndrome, right upper limb: Secondary | ICD-10-CM | POA: Diagnosis not present

## 2022-07-05 NOTE — Progress Notes (Signed)
Office Visit Note   Patient: Cheryl Gonzalez           Date of Birth: [birth year]           MRN: [id number] Visit Date: 07/05/2022              Requested by: Valeria Batman, MD [address] [place],  [place] [address] PCP: Raliegh Ip, DO   Assessment & Plan: Visit Diagnoses: Right Carpal Tunnel Syndrome   Plan: Right carpal tunnel syndrome failed conservative treatment.  She has positive electrical test she still wearing the splint every night and wants to proceed with right carpal tunnel release.  She did well with her opposite left and by Dr. Cleophas Dunker last January.  Follow-Up Instructions: No follow-ups on file.   Orders:  No orders of the defined types were placed in this encounter.  No orders of the defined types were placed in this encounter.     Procedures: No procedures performed   Clinical Data: No additional findings.   Subjective: Chief Complaint  Patient presents with   Right Hand - Numbness    HPI 71 year old female previous left carpal tunnel release by Dr. Cleophas Dunker with positive electrical studies for both hands now presents with progressive right hand pain.  States she cannot sleep she is wearing a splint is still tremendously painful and she wants to proceed with carpal tunnel release for the right hand after the holidays in late January.  Review of Systems all systems updated negative.   Objective: Vital Signs: Ht 5' 6.5" (1.689 m)   Wt 139 lb (63 kg)   BMI 22.10 kg/m   Physical Exam Constitutional:      Appearance: She is well-developed.  HENT:     Head: Normocephalic.     Right Ear: External ear normal.     Left Ear: External ear normal. There is no impacted cerumen.  Eyes:     Pupils: Pupils are equal, round, and reactive to light.  Neck:     Thyroid: No thyromegaly.     Trachea: No tracheal deviation.  Cardiovascular:     Rate and Rhythm: Normal rate.  Pulmonary:     Effort: Pulmonary effort is normal.  Abdominal:      Palpations: Abdomen is soft.  Musculoskeletal:     Cervical back: No rigidity.  Skin:    General: Skin is warm and dry.  Neurological:     Mental Status: She is alert and oriented to person, place, and time.  Psychiatric:        Behavior: Behavior normal.     Ortho Exam healed left surgical carpal tunnel release scar.  Right wrist has normal capillary refill.  Positive Tinel's positive Phalen's decreased sensation median distribution.  Good cervical range of motion no brachial plexus tenderness.  Tenderness to base of the thumb first [facility] joint symmetrical right and left.  Specialty Comments:  No specialty comments available.  Imaging: No results found.   PMFS History: Patient Active Problem List   Diagnosis Date Noted   Carpal tunnel syndrome, bilateral 06/28/2021   Primary osteoarthritis, left shoulder 09/21/2020   Tobacco abuse 11/04/2017   Primary osteoarthritis of left knee 10/29/2017   Allergic rhinitis 03/28/2005   Sensorineural hearing loss 03/28/2005   Past Medical History:  Diagnosis Date   Arthritis    Cancer (HCC) 2017   melanoma   Cigarette smoker    History of meniscal tear    Hyperlipidemia     Family History  Problem Relation Age of Onset   Arthritis Mother    Stroke Mother 39   Hearing loss Mother    Heart attack Father        as a result of anesthesia/ ankle surgery   Cirrhosis Brother    Alcohol abuse Brother     Past Surgical History:  Procedure Laterality Date   JOINT REPLACEMENT     KNEE SURGERY Right    SHOULDER SURGERY Left    Social History   Occupational History   Not on file  Tobacco Use   Smoking status: Every Day    Packs/day: 0.25    Years: 15.00    Total pack years: 3.75    Types: Cigarettes   Smokeless tobacco: Never   Tobacco comments:    Two cigarettes daily. 28 pack year history (until 2019)  Vaping Use   Vaping Use: Never used  Substance and Sexual Activity   Alcohol use: Not Currently    Comment: Several  times yearly.   Drug use: Never   Sexual activity: Yes    Birth control/protection: Post-menopausal

## 2022-08-09 ENCOUNTER — Telehealth: Payer: Self-pay

## 2022-08-09 NOTE — Telephone Encounter (Signed)
Called patient to speak about scheduling CTR.  We had called back and forth trying to reach each other.  She has decided to have a different doctor do the surgery.

## 2022-08-16 DIAGNOSIS — G5601 Carpal tunnel syndrome, right upper limb: Secondary | ICD-10-CM | POA: Diagnosis not present

## 2022-08-29 DIAGNOSIS — G5601 Carpal tunnel syndrome, right upper limb: Secondary | ICD-10-CM | POA: Diagnosis not present

## 2022-09-12 DIAGNOSIS — G5601 Carpal tunnel syndrome, right upper limb: Secondary | ICD-10-CM | POA: Diagnosis not present

## 2023-01-02 DIAGNOSIS — H40013 Open angle with borderline findings, low risk, bilateral: Secondary | ICD-10-CM | POA: Diagnosis not present

## 2023-01-02 DIAGNOSIS — H524 Presbyopia: Secondary | ICD-10-CM | POA: Diagnosis not present

## 2023-01-02 DIAGNOSIS — H35371 Puckering of macula, right eye: Secondary | ICD-10-CM | POA: Diagnosis not present

## 2023-01-22 DIAGNOSIS — L659 Nonscarring hair loss, unspecified: Secondary | ICD-10-CM | POA: Diagnosis not present

## 2023-01-22 DIAGNOSIS — L570 Actinic keratosis: Secondary | ICD-10-CM | POA: Diagnosis not present

## 2023-01-22 DIAGNOSIS — D485 Neoplasm of uncertain behavior of skin: Secondary | ICD-10-CM | POA: Diagnosis not present

## 2023-01-22 DIAGNOSIS — C4371 Malignant melanoma of right lower limb, including hip: Secondary | ICD-10-CM | POA: Diagnosis not present

## 2023-01-22 DIAGNOSIS — D0339 Melanoma in situ of other parts of face: Secondary | ICD-10-CM | POA: Diagnosis not present

## 2023-02-14 DIAGNOSIS — L905 Scar conditions and fibrosis of skin: Secondary | ICD-10-CM | POA: Diagnosis not present

## 2023-02-14 DIAGNOSIS — C4371 Malignant melanoma of right lower limb, including hip: Secondary | ICD-10-CM | POA: Diagnosis not present

## 2023-02-28 DIAGNOSIS — C4339 Malignant melanoma of other parts of face: Secondary | ICD-10-CM | POA: Diagnosis not present

## 2023-02-28 DIAGNOSIS — L905 Scar conditions and fibrosis of skin: Secondary | ICD-10-CM | POA: Diagnosis not present

## 2023-06-20 ENCOUNTER — Telehealth: Payer: Self-pay

## 2023-06-20 DIAGNOSIS — J00 Acute nasopharyngitis [common cold]: Secondary | ICD-10-CM | POA: Diagnosis not present

## 2023-06-20 NOTE — Patient Outreach (Signed)
Successful call to patient on today regarding preventative mammogram screening. Patient declined at this time and will follow up with PCP at later date.  Baruch Gouty [facility] Assistant [facility] [phone]

## 2023-07-09 DIAGNOSIS — L570 Actinic keratosis: Secondary | ICD-10-CM | POA: Diagnosis not present

## 2023-07-09 DIAGNOSIS — L821 Other seborrheic keratosis: Secondary | ICD-10-CM | POA: Diagnosis not present

## 2023-07-09 DIAGNOSIS — D485 Neoplasm of uncertain behavior of skin: Secondary | ICD-10-CM | POA: Diagnosis not present

## 2023-08-26 DIAGNOSIS — S0511XA Contusion of eyeball and orbital tissues, right eye, initial encounter: Secondary | ICD-10-CM | POA: Diagnosis not present

## 2023-11-05 DIAGNOSIS — L570 Actinic keratosis: Secondary | ICD-10-CM | POA: Diagnosis not present

## 2023-11-05 DIAGNOSIS — L649 Androgenic alopecia, unspecified: Secondary | ICD-10-CM | POA: Diagnosis not present

## 2024-01-03 DIAGNOSIS — H40013 Open angle with borderline findings, low risk, bilateral: Secondary | ICD-10-CM | POA: Diagnosis not present

## 2024-01-03 DIAGNOSIS — H1045 Other chronic allergic conjunctivitis: Secondary | ICD-10-CM | POA: Diagnosis not present

## 2024-01-03 DIAGNOSIS — H524 Presbyopia: Secondary | ICD-10-CM | POA: Diagnosis not present

## 2024-03-09 DIAGNOSIS — L570 Actinic keratosis: Secondary | ICD-10-CM | POA: Diagnosis not present

## 2024-03-09 DIAGNOSIS — L649 Androgenic alopecia, unspecified: Secondary | ICD-10-CM | POA: Diagnosis not present

## 2024-06-10 ENCOUNTER — Telehealth: Payer: Self-pay

## 2024-06-10 NOTE — Telephone Encounter (Signed)
 Listed as PCP with insurance. LMOM for patient to call. If still a patient will need to make appt.

## 2024-06-11 ENCOUNTER — Telehealth: Payer: Self-pay

## 2024-06-11 NOTE — Telephone Encounter (Signed)
 Patient has not been seen recently. [facility] is listed as PCP with [employer]. LMOM for pt to return call to see if she needs to re-establish or has PCP elsewhere.
